# Patient Record
Sex: Female | Born: 1940
Health system: Southern US, Community
[De-identification: ages and names within clinical notes are randomized; demographics above are authoritative.]

## PROBLEM LIST (undated history)

## (undated) ENCOUNTER — Ambulatory Visit (HOSPITAL_BASED_OUTPATIENT_CLINIC_OR_DEPARTMENT_OTHER)

## (undated) DIAGNOSIS — I10 Essential (primary) hypertension: Secondary | ICD-10-CM

## (undated) DIAGNOSIS — F0390 Unspecified dementia without behavioral disturbance: Secondary | ICD-10-CM

## (undated) DIAGNOSIS — E282 Polycystic ovarian syndrome: Secondary | ICD-10-CM

## (undated) DIAGNOSIS — M7989 Other specified soft tissue disorders: Secondary | ICD-10-CM

## (undated) DIAGNOSIS — R413 Other amnesia: Secondary | ICD-10-CM

## (undated) HISTORY — DX: Other specified soft tissue disorders: M79.89

## (undated) HISTORY — PX: CHOLECYSTECTOMY: SHX55

## (undated) HISTORY — DX: Other amnesia: R41.3

## (undated) HISTORY — PX: OTHER SURGICAL HISTORY: SHX169

## (undated) HISTORY — PX: ABDOMINAL HYSTERECTOMY: SHX81

## (undated) HISTORY — DX: Unspecified dementia, unspecified severity, without behavioral disturbance, psychotic disturbance, mood disturbance, and anxiety: F03.90

---

## 1999-09-16 ENCOUNTER — Ambulatory Visit (HOSPITAL_COMMUNITY): Admission: RE | Admit: 1999-09-16 | Discharge: 1999-09-16 | Payer: Self-pay | Admitting: Family Medicine

## 2011-11-28 ENCOUNTER — Ambulatory Visit (HOSPITAL_COMMUNITY)
Admission: RE | Admit: 2011-11-28 | Discharge: 2011-11-28 | Disposition: A | Discharge status: Home / Self Care | Payer: BC Managed Care – PPO | Source: Ambulatory Visit | Attending: Obstetrics and Gynecology | Admitting: Obstetrics and Gynecology

## 2011-11-28 DIAGNOSIS — M7989 Other specified soft tissue disorders: Secondary | ICD-10-CM

## 2011-11-28 DIAGNOSIS — M79609 Pain in unspecified limb: Secondary | ICD-10-CM | POA: Insufficient documentation

## 2011-11-28 DIAGNOSIS — M79606 Pain in leg, unspecified: Secondary | ICD-10-CM

## 2011-11-28 HISTORY — DX: Other specified soft tissue disorders: M79.89

## 2011-11-28 NOTE — Progress Notes (Signed)
VASCULAR LAB PRELIMINARY  PRELIMINARY  PRELIMINARY  PRELIMINARY  Left lower venous duplex completed.    Preliminary report: Left leg is negative for deep and superficial vein thrombosis.   Vanna Scotland,   RVT                11/28/2011, 3:48 PM

## 2012-04-01 ENCOUNTER — Encounter (INDEPENDENT_AMBULATORY_CARE_PROVIDER_SITE_OTHER): Payer: BC Managed Care – PPO | Admitting: Ophthalmology

## 2012-04-01 DIAGNOSIS — H43819 Vitreous degeneration, unspecified eye: Secondary | ICD-10-CM

## 2012-04-01 DIAGNOSIS — H33309 Unspecified retinal break, unspecified eye: Secondary | ICD-10-CM

## 2012-04-01 DIAGNOSIS — H3530 Unspecified macular degeneration: Secondary | ICD-10-CM

## 2012-04-08 ENCOUNTER — Ambulatory Visit (INDEPENDENT_AMBULATORY_CARE_PROVIDER_SITE_OTHER): Payer: BC Managed Care – PPO | Admitting: Ophthalmology

## 2012-04-08 DIAGNOSIS — H33309 Unspecified retinal break, unspecified eye: Secondary | ICD-10-CM

## 2012-08-05 ENCOUNTER — Encounter (HOSPITAL_COMMUNITY): Payer: Self-pay | Admitting: Adult Health

## 2012-08-05 ENCOUNTER — Emergency Department (HOSPITAL_COMMUNITY): Payer: BC Managed Care – PPO

## 2012-08-05 ENCOUNTER — Emergency Department (HOSPITAL_COMMUNITY)
Admission: EM | Admit: 2012-08-05 | Discharge: 2012-08-06 | Disposition: A | Discharge status: Home / Self Care | Payer: BC Managed Care – PPO | Attending: Emergency Medicine | Admitting: Emergency Medicine

## 2012-08-05 DIAGNOSIS — R0789 Other chest pain: Secondary | ICD-10-CM | POA: Insufficient documentation

## 2012-08-05 DIAGNOSIS — I10 Essential (primary) hypertension: Secondary | ICD-10-CM | POA: Insufficient documentation

## 2012-08-05 DIAGNOSIS — R0602 Shortness of breath: Secondary | ICD-10-CM | POA: Insufficient documentation

## 2012-08-05 DIAGNOSIS — Z8742 Personal history of other diseases of the female genital tract: Secondary | ICD-10-CM | POA: Insufficient documentation

## 2012-08-05 HISTORY — DX: Essential (primary) hypertension: I10

## 2012-08-05 HISTORY — DX: Polycystic ovarian syndrome: E28.2

## 2012-08-05 LAB — BASIC METABOLIC PANEL
Calcium: 9.8 mg/dL (ref 8.4–10.5)
GFR calc Af Amer: >90 mL/min (ref >90)
GFR calc non Af Amer: >90 mL/min (ref >90)
Sodium: 135 mEq/L (ref 135–145)

## 2012-08-05 LAB — CBC
MCH: 28.6 pg (ref 26.0–34.0)
MCHC: 34.1 g/dL (ref 30.0–36.0)
Platelets: 296 10*3/uL (ref 150–400)
RBC: 4.58 MIL/uL (ref 3.87–5.11)
RDW: 13.3 % (ref 11.5–15.5)

## 2012-08-05 LAB — POCT I-STAT TROPONIN I: Troponin i, poc: 0.01 ng/mL (ref 0.00–0.08)

## 2012-08-05 MED ORDER — NITROGLYCERIN 0.4 MG SL SUBL: 0.4000 mg | SUBLINGUAL_TABLET | SUBLINGUAL | Status: DC | PRN

## 2012-08-05 NOTE — ED Notes (Signed)
Report taken from [facility], I was advised that EDP would like to "hold off on IV for now".  Await further orders at this time

## 2012-08-05 NOTE — ED Notes (Signed)
Patient transported to X-ray 

## 2012-08-05 NOTE — ED Notes (Signed)
Dr. Rancour at bedside. 

## 2012-08-05 NOTE — ED Provider Notes (Signed)
History     CSN: [id number]  Arrival date & time 08/05/12  2101   First MD Initiated Contact with Patient 08/05/12 2107      Chief Complaint  Patient presents with  . Chest Pain    (Consider location/radiation/quality/duration/timing/severity/associated sxs/prior treatment) HPI Comments: Patient history hypertension, no known coronary artery disease, on metformin for PCOS -- presents with complaint of chest tightness and shortness of breath that began at about 2 PM today. Patient states that the symptoms were worsened because she was rushing to make an appointment. She had her blood pressure checked and found to be elevated. Patient states that it continued to get higher at home so she went to an urgent care and was told to come to the emergency department for evaluation. Tightness is retrosternal it does not radiate. It was not associated with palpitations or nausea/vomiting. Patient was given aspirin prior to arrival. Patient denies other medical complaints today. Onset of symptoms acute. Course is constant. Nothing makes symptoms better.  The history is provided by the patient.    Past Medical History  Diagnosis Date  . Hypertension   . Polycystic disease, ovaries     History reviewed. No pertinent past surgical history.  History reviewed. No pertinent family history.  History  Substance Use Topics  . Smoking status: Never Smoker   . Smokeless tobacco: Not on file  . Alcohol Use: No    OB History    Grav Para Term Preterm Abortions TAB SAB Ect Mult Living                  Review of Systems  Constitutional: Negative for fever and diaphoresis.  HENT: Negative for neck pain.   Eyes: Negative for redness.  Respiratory: Positive for shortness of breath. Negative for cough.   Cardiovascular: Positive for chest pain. Negative for palpitations and leg swelling.  Gastrointestinal: Negative for nausea, vomiting and abdominal pain.  Genitourinary: Negative for dysuria.    Musculoskeletal: Negative for back pain.  Skin: Negative for rash.  Neurological: Negative for syncope and light-headedness.    Allergies  Sulfa antibiotics  Home Medications  No current outpatient prescriptions on file.  There were no vitals taken for this visit.  Physical Exam  Nursing note and vitals reviewed. Constitutional: She appears well-developed and well-nourished.  HENT:  Head: Normocephalic and atraumatic.  Mouth/Throat: Mucous membranes are normal. Mucous membranes are not dry.  Eyes: Conjunctivae normal are normal.  Neck: Trachea normal and normal range of motion. Neck supple. Normal carotid pulses and no JVD present. No muscular tenderness present. Carotid bruit is not present. No tracheal deviation present.  Cardiovascular: Normal rate, regular rhythm, S1 normal, S2 normal, normal heart sounds and intact distal pulses.  Exam reveals no decreased pulses.   No murmur heard. Pulmonary/Chest: Effort normal. No respiratory distress. She has no wheezes. She exhibits no tenderness.  Abdominal: Soft. Normal aorta and bowel sounds are normal. There is no tenderness. There is no rebound and no guarding.  Musculoskeletal: Normal range of motion.  Neurological: She is alert.  Skin: Skin is warm and dry. She is not diaphoretic. No cyanosis. No pallor.  Psychiatric: She has a normal mood and affect.    ED Course  Procedures (including critical care time)  Labs Reviewed  BASIC METABOLIC PANEL - Abnormal; Notable for the following:    Potassium 3.3 (*)     Chloride 93 (*)     All other components within normal limits  CBC  POCT  I-STAT TROPONIN I  POCT I-STAT TROPONIN I  LAB REPORT - SCANNED   Dg Chest 2 View  08/05/2012  *RADIOLOGY REPORT*  Clinical Data: Chest tightness, hypertension  CHEST - 2 VIEW  Comparison: None.  Findings: Lungs are clear. No pleural effusion or pneumothorax.  Cardiomediastinal silhouette is within normal limits.  Degenerative changes of the  visualized thoracolumbar spine.  Cholecystectomy clips.  IMPRESSION: No evidence of acute cardiopulmonary disease.   Original Report Authenticated By: Charline Bills, M.D.      1. Chest tightness   2. Hypertension     9:14 PM Patient seen and examined. Work-up initiated. Medications ordered.   Vital signs reviewed and are as follows: Filed Vitals:   08/05/12 2115  BP: 168/134  Pulse: 78     Date: 08/05/2012  Rate: 75  Rhythm: normal sinus rhythm  QRS Axis: normal  Intervals: normal  ST/T Wave abnormalities: normal  Conduction Disutrbances:none  Narrative Interpretation:   Old EKG Reviewed: none available  11:41 PM Chest pain is now resolved. She did not receive NTG.   Patient seen previously by Dr. Manus Gunning. Will get 2nd troponin. If neg, patient can follow-up with PCP.   Patient was counseled to return with severe chest pain, especially if the pain is crushing or pressure-like and spreads to the arms, back, neck, or jaw, or if they have sweating, nausea, or shortness of breath with the pain. They were encouraged to call 911 with these symptoms.   They were also told to return if their chest pain gets worse and does not go away with rest, they have an attack of chest pain lasting longer than usual despite rest and treatment with the medications their caregiver has prescribed, if they wake from sleep with chest pain or shortness of breath, if they feel dizzy or faint, if they have chest pain not typical of their usual pain, or if they have any other emergent concerns regarding their health.  The patient verbalized understanding and agreed.     MDM  Patient with chest tightness.  Doubt cardiac origin of pain given: neg cardiac enzymes, normal EKG,  no exertional component, lack of accompanying sx including palps, sweating, SOB, radiation. Patient has HTN without signs of end-organ damage. Patient seems reliable and is agreeable to follow-up -- return if worsening.   EKG  normal.          Renne Crigler, PA 08/06/12 1552

## 2012-08-05 NOTE — ED Provider Notes (Signed)
This chart was scribed for Maria Octave, MD by Bennett Scrape, ED Scribe. This patient was seen in room A03C/A03C and the patient's care was started at 10:25 PM.  Maria Gilmore is a 72 y.o. female who presents to the Emergency Department from [facility] complaining of HTN with associated intermittent chest tightness lasted a few seconds at a time that she attributes to anxiety. Pt states that her mother is in hospice currently and reports that she went to visit her today during her lunch break. She states that she "hurried up" one flight of stairs to get to her mother's room to see the hospice nurse. When she arrived in the room, she felt SOB and nurse took BP and noted it was high. She reports taking her BP 3 more subsequent times with an increase each time. BP is 172/83. She denies having symptoms currently. Pt denies having SOB at baseline, with exertion or orthopnea. She is currently on hydrochlorothiazide for HTN and denies recent missed doses or changes in medications. She reports prior episodes of chest tightness during times of stress and denies having prior heart or lung conditions. She also denies having prior stress or catheterizations done. She denies abdominal pain, diaphoresis, emesis and nausea as associated symptoms. She is also on metformin for polycystic ovaries.   PCP is Dr. Tilden Fossa Sees Dr. Allyson Sabal for HTN  PE: CONSITUTIONAL: No distress CV: Regular rate and rhythm  CHEST: No reproducible chest tenderness upon palpation  LUNG: No distress  10:35 PM-Advised pt that I don't believe that her symptoms are cardiac related. Discussed treatment plan which includes outpatient workup with pt at bedside and pt agreed to plan.   Atypical chest "tightness" lasting a few seconds at a time, now resolved.  I personally performed the services described in this documentation, which was scribed in my presence. The recorded information has been reviewed and is accurate.  12:11 AM 72 y/o  female awaiting second troponin results, if negative discharge. Second troponin negative. Patient will be discharged. Instructions were given to her from [place], [place].  Trevor Mace, PA-C 08/06/12 0012  Maria Octave, MD 08/06/12 [phone]

## 2012-08-05 NOTE — ED Notes (Addendum)
Presents with high blood pressure, chest pain and SOB that began today. She went to work and took her blood pressure and it was 172/90. Chest pain is described as tightness and that is worse with exertion and associated with SOB. Denies pedal edema. She denies pain at this time.

## 2012-08-06 NOTE — ED Provider Notes (Signed)
Medical screening examination/treatment/procedure(s) were conducted as a shared visit with non-physician practitioner(s) and myself.  I personally evaluated the patient during the encounter  See my additional note  [name], MD 08/06/12 2204

## 2012-08-08 ENCOUNTER — Other Ambulatory Visit (HOSPITAL_COMMUNITY): Payer: Self-pay | Admitting: Cardiology

## 2012-08-08 DIAGNOSIS — R079 Chest pain, unspecified: Secondary | ICD-10-CM

## 2012-08-12 ENCOUNTER — Ambulatory Visit (INDEPENDENT_AMBULATORY_CARE_PROVIDER_SITE_OTHER): Payer: BC Managed Care – PPO | Admitting: Ophthalmology

## 2012-08-12 DIAGNOSIS — H35039 Hypertensive retinopathy, unspecified eye: Secondary | ICD-10-CM

## 2012-08-12 DIAGNOSIS — H2510 Age-related nuclear cataract, unspecified eye: Secondary | ICD-10-CM

## 2012-08-12 DIAGNOSIS — H43819 Vitreous degeneration, unspecified eye: Secondary | ICD-10-CM

## 2012-08-12 DIAGNOSIS — I10 Essential (primary) hypertension: Secondary | ICD-10-CM

## 2012-08-12 DIAGNOSIS — H33309 Unspecified retinal break, unspecified eye: Secondary | ICD-10-CM

## 2012-08-16 ENCOUNTER — Ambulatory Visit (HOSPITAL_COMMUNITY)
Admission: RE | Admit: 2012-08-16 | Discharge: 2012-08-16 | Disposition: A | Discharge status: Home / Self Care | Payer: BC Managed Care – PPO | Source: Ambulatory Visit | Attending: Cardiovascular Disease | Admitting: Cardiovascular Disease

## 2012-08-16 DIAGNOSIS — R079 Chest pain, unspecified: Secondary | ICD-10-CM | POA: Insufficient documentation

## 2012-08-16 DIAGNOSIS — I10 Essential (primary) hypertension: Secondary | ICD-10-CM | POA: Insufficient documentation

## 2012-08-16 DIAGNOSIS — R0602 Shortness of breath: Secondary | ICD-10-CM | POA: Insufficient documentation

## 2012-08-16 HISTORY — PX: OTHER SURGICAL HISTORY: SHX169

## 2012-08-16 MED ORDER — TECHNETIUM TC 99M SESTAMIBI GENERIC - CARDIOLITE
10.6000 | Freq: Once | INTRAVENOUS | Status: AC | PRN
  Administered 2012-08-16: 11 via INTRAVENOUS

## 2012-08-16 MED ORDER — TECHNETIUM TC 99M SESTAMIBI GENERIC - CARDIOLITE
30.8000 | Freq: Once | INTRAVENOUS | Status: AC | PRN
  Administered 2012-08-16: 30.8 via INTRAVENOUS

## 2012-08-16 NOTE — Procedures (Addendum)
[facility] [address] [place] [place] [address] [phone]  Cardiology Nuclear Med Study  Maria Gilmore is a 72 y.o. female     MRN : [id number]     DOB: 12/06/1940  Procedure Date: 08/16/2012  Nuclear Med Background Indication for Stress Test:  Evaluation for Ischemia and Post Hospital History:  no prior cardiac history Cardiac Risk Factors: Hypertension  Symptoms:  Chest Pain and SOB   Nuclear Pre-Procedure Caffeine/Decaff Intake:  7:00pm NPO After: 5:00am   IV Site: R Antecubital  IV 0.9% NS with Angio Cath:  22g  Chest Size (in):  n/a IV Started by: Koren Shiver, CNMT  Height: 5\' 2"  (1.575 m)  Cup Size: C  BMI:  Body mass index is 23.41 kg/(m^2). Weight:  128 lb (58.06 kg)   Tech Comments:  n/a    Nuclear Med Study 1 or 2 day study: 1 day  Stress Test Type:  Stress  Order Authorizing Provider:  Nanetta Batty, MD   Resting Radionuclide: Technetium 30m Sestamibi  Resting Radionuclide Dose: 10.6 mCi   Stress Radionuclide:  Technetium 79m Sestamibi  Stress Radionuclide Dose: 30.8 mCi           Stress Protocol Rest HR: 74 Stress HR: 146  Rest BP: 160/93 Stress BP: 209/107  Exercise Time (min): 6:00 METS: 7.1   Predicted Max HR: 151 bpm % Max HR: 96.69 bpm Rate Pressure Product: 46962   Dose of Adenosine (mg):  n/a Dose of Lexiscan: n/a mg  Dose of Atropine (mg): n/a Dose of Dobutamine: n/a mcg/kg/min (at max HR)  Stress Test Technologist: Esperanza Sheets, CCT Nuclear Technologist: Gonzella Lex, CNMT   Rest Procedure:  Myocardial perfusion imaging was performed at rest 45 minutes following the intravenous administration of Technetium 72m Sestamibi. Stress Procedure:  The patient performed treadmill exercise using a Bruce  Protocol for 6:00 minutes. The patient stopped due to SOB and denied any chest pain.  There were no significant ST-T wave changes.  Technetium 31m Sestamibi was injected at peak exercise and  myocardial perfusion imaging was performed after a brief delay.  Transient Ischemic Dilatation (Normal <1.22):  1.08 Lung/Heart Ratio (Normal <0.45):  0.26 QGS EDV:  37 ml QGS ESV:  7 ml LV Ejection Fraction: 80%  Signed by .     Rest ECG: NSR - Normal EKG  Stress ECG: No significant ST segment change suggestive of ischemia. Rare PVC  QPS Raw Data Images:  Normal; no motion artifact; normal heart/lung ratio. Stress Images:  Normal homogeneous uptake in all areas of the myocardium. Rest Images:  Normal homogeneous uptake in all areas of the myocardium. Subtraction (SDS):  Normal  Impression Exercise Capacity:  Good exercise capacity. BP Response:  Hypertensive blood pressure response. Clinical Symptoms:  No symptoms. ECG Impression:  No significant ST segment change suggestive of ischemia. Comparison with Prior Nuclear Study: No previous nuclear study performed  Overall Impression:  Normal stress nuclear study. Low risk stress nuclear study.  LV Wall Motion:  NL LV Function, EF 80%; NL Wall Motion   Nicki Guadalajara A, MD  08/16/2012 12:22 PM

## 2012-09-30 ENCOUNTER — Encounter: Payer: Self-pay | Admitting: *Deleted

## 2012-10-14 ENCOUNTER — Encounter: Payer: Self-pay | Admitting: Cardiovascular Disease

## 2013-06-03 ENCOUNTER — Ambulatory Visit (INDEPENDENT_AMBULATORY_CARE_PROVIDER_SITE_OTHER): Payer: BC Managed Care – PPO | Admitting: Cardiology

## 2013-06-03 ENCOUNTER — Encounter: Payer: Self-pay | Admitting: Cardiology

## 2013-06-03 VITALS — BP 140/70 | HR 76 | Ht 62.0 in | Wt 134.0 lb

## 2013-06-03 DIAGNOSIS — I10 Essential (primary) hypertension: Secondary | ICD-10-CM | POA: Insufficient documentation

## 2013-06-03 DIAGNOSIS — E282 Polycystic ovarian syndrome: Secondary | ICD-10-CM

## 2013-06-03 NOTE — Progress Notes (Signed)
06/03/2013 Maria Gilmore   [birth year]  [id number]  Primary Physicia Alysia Penna, MD Primary Cardiologist: Dr Allyson Sabal  HPI:  The patient is seen in the office today in followup. She had had an episode of chest pain and was seen in the emergency room in Jan 2014. She was hypertensive during that episode. It turns out she has been under a great deal of stress at home and the patient attributed her symptoms to that. We saw her in the office August 08, 2012, and set her up for an exercise Myoview. This was done August 16, 2012. She exercised 6 minutes of the Bruce protocol to a maximum heart rate 146. There was no significant ischemia and normal LV function. On that August 08, 2012, visit, we added Cozaar 25 mg and cut her diuretic back. Since we saw her last she has been doing well. She had her Cozaar increased to 50 mg but says she couldn't tolerate that dose. She has been doing well with her B/P.     Current Outpatient Prescriptions  Medication Sig Dispense Refill  . cholecalciferol (VITAMIN D) 1000 UNITS tablet Take 1,000 Units by mouth daily.      . hydrochlorothiazide (HYDRODIURIL) 25 MG tablet Take 50 mg by mouth daily.      Marland Kitchen losartan (COZAAR) 25 MG tablet Take 25 mg by mouth daily.      . metFORMIN (GLUCOPHAGE) 1000 MG tablet Take 1,000 mg by mouth 2 (two) times daily with a meal.      . vitamin E 100 UNIT capsule Take 100 Units by mouth daily.       No current facility-administered medications for this visit.    Allergies  Allergen Reactions  . Kenalog [Triamcinolone]   . Sulfa Antibiotics     History   Social History  . Marital Status: Married    Spouse Name: N/A    Number of Children: N/A  . Years of Education: N/A   Occupational History  . Not on file.   Social History Main Topics  . Smoking status: Never Smoker   . Smokeless tobacco: Not on file  . Alcohol Use: No  . Drug Use: No  . Sexual Activity: Not on file   Other Topics Concern  . Not on file    Social History Narrative  . No narrative on file   She lives near her 52 y/o father who still lives alone in his own home. Her mother died last year. She is still working at [employer]  Review of Systems: General: negative for chills, fever, night sweats or weight changes.  Cardiovascular: negative for chest pain, dyspnea on exertion, edema, orthopnea, palpitations, paroxysmal nocturnal dyspnea or shortness of breath Dermatological: negative for rash Respiratory: negative for cough or wheezing Urologic: negative for hematuria Abdominal: negative for nausea, vomiting, diarrhea, bright red blood per rectum, melena, or hematemesis Neurologic: negative for visual changes, syncope, or dizziness She will need cataract surgery soon She could not tolerate higher dose of Cozaar-"itching" All other systems reviewed and are otherwise negative except as noted above.    Blood pressure 140/70, pulse 76, height 5\' 2"  (1.575 m), weight 134 lb (60.782 kg).  General appearance: alert, cooperative and no distress Neck: no carotid bruit and no JVD Lungs: clear to auscultation bilaterally Heart: regular rate and rhythm  EKG NSR   ASSESSMENT AND PLAN:   HTN (hypertension) B/P controlled  Polycystic ovarian disease On Metformin   PLAN  She had a low risk Myoview  after an ER visit for chest pain in Feb 2014. No chest pain. We can see her on on yearly.   Maria Gilmore KPA-C 06/03/2013 9:18 AM

## 2013-06-03 NOTE — Assessment & Plan Note (Signed)
B/P controlled 

## 2013-06-03 NOTE — Patient Instructions (Signed)
Your physician recommends that you schedule a follow-up appointment in: one year with Dr. Berry  

## 2013-06-03 NOTE — Assessment & Plan Note (Signed)
On Metformin 

## 2013-06-04 ENCOUNTER — Encounter: Payer: Self-pay | Admitting: Cardiology

## 2014-01-16 DIAGNOSIS — Z01419 Encounter for gynecological examination (general) (routine) without abnormal findings: Secondary | ICD-10-CM | POA: Diagnosis not present

## 2014-01-16 DIAGNOSIS — E559 Vitamin D deficiency, unspecified: Secondary | ICD-10-CM | POA: Diagnosis not present

## 2014-01-16 DIAGNOSIS — Z79899 Other long term (current) drug therapy: Secondary | ICD-10-CM | POA: Diagnosis not present

## 2014-01-16 DIAGNOSIS — E282 Polycystic ovarian syndrome: Secondary | ICD-10-CM | POA: Diagnosis not present

## 2014-02-03 DIAGNOSIS — M949 Disorder of cartilage, unspecified: Secondary | ICD-10-CM | POA: Diagnosis not present

## 2014-02-03 DIAGNOSIS — M899 Disorder of bone, unspecified: Secondary | ICD-10-CM | POA: Diagnosis not present

## 2014-02-03 DIAGNOSIS — I10 Essential (primary) hypertension: Secondary | ICD-10-CM | POA: Diagnosis not present

## 2014-02-10 DIAGNOSIS — E559 Vitamin D deficiency, unspecified: Secondary | ICD-10-CM | POA: Diagnosis not present

## 2014-02-10 DIAGNOSIS — Z0000 Encounter for general adult medical examination without abnormal findings: Secondary | ICD-10-CM | POA: Diagnosis not present

## 2014-02-10 DIAGNOSIS — IMO0002 Reserved for concepts with insufficient information to code with codable children: Secondary | ICD-10-CM | POA: Diagnosis not present

## 2014-02-10 DIAGNOSIS — Z23 Encounter for immunization: Secondary | ICD-10-CM | POA: Diagnosis not present

## 2014-02-10 DIAGNOSIS — M949 Disorder of cartilage, unspecified: Secondary | ICD-10-CM | POA: Diagnosis not present

## 2014-02-10 DIAGNOSIS — E282 Polycystic ovarian syndrome: Secondary | ICD-10-CM | POA: Diagnosis not present

## 2014-02-10 DIAGNOSIS — H269 Unspecified cataract: Secondary | ICD-10-CM | POA: Diagnosis not present

## 2014-02-10 DIAGNOSIS — M899 Disorder of bone, unspecified: Secondary | ICD-10-CM | POA: Diagnosis not present

## 2014-02-10 DIAGNOSIS — Z1331 Encounter for screening for depression: Secondary | ICD-10-CM | POA: Diagnosis not present

## 2014-02-10 DIAGNOSIS — I10 Essential (primary) hypertension: Secondary | ICD-10-CM | POA: Diagnosis not present

## 2014-02-12 DIAGNOSIS — Z1212 Encounter for screening for malignant neoplasm of rectum: Secondary | ICD-10-CM | POA: Diagnosis not present

## 2014-04-07 DIAGNOSIS — Z23 Encounter for immunization: Secondary | ICD-10-CM | POA: Diagnosis not present

## 2014-05-28 DIAGNOSIS — H33322 Round hole, left eye: Secondary | ICD-10-CM | POA: Diagnosis not present

## 2014-05-29 ENCOUNTER — Ambulatory Visit (INDEPENDENT_AMBULATORY_CARE_PROVIDER_SITE_OTHER): Payer: Medicare Other | Admitting: Cardiovascular Disease

## 2014-05-29 ENCOUNTER — Encounter: Payer: Self-pay | Admitting: Cardiovascular Disease

## 2014-05-29 VITALS — BP 144/88 | HR 70 | Ht 61.5 in | Wt 128.3 lb

## 2014-05-29 DIAGNOSIS — I10 Essential (primary) hypertension: Secondary | ICD-10-CM | POA: Diagnosis not present

## 2014-05-29 NOTE — Patient Instructions (Signed)
Your physician wants you to follow-up in: 1 year with Dr Berry. You will receive a reminder letter in the mail two months in advance. If you don't receive a letter, please call our office to schedule the follow-up appointment.  

## 2014-05-29 NOTE — Progress Notes (Signed)
     05/29/2014 Maria Gilmore   February 09, 1941  [id number]  Primary Physician Velna Hatchet, MD Primary Cardiologist: Lorretta Harp MD Renae Gloss   HPI:  Maria Gilmore  is a 73 year old thin-appearing liver Caucasian female, mother of 2 children, grandmother of 5 grandchildren who I initially saw at the request of Dr. Vernice Jefferson 06/26/11. She has a history of hypertension and polycystic ovarian disease on metformin. She had a Myoview stress test performed 08/26/12 which is entirely normal. She denies chest pain or shortness of breath. She does exercise several days a week and has adjusted her diet accordingly.   Current Outpatient Prescriptions  Medication Sig Dispense Refill  . cholecalciferol (VITAMIN D) 1000 UNITS tablet Take 1,000 Units by mouth daily.    . hydrochlorothiazide (HYDRODIURIL) 25 MG tablet Take 25 mg by mouth daily.     Marland Kitchen losartan (COZAAR) 25 MG tablet Take 25 mg by mouth daily.    . metFORMIN (GLUCOPHAGE) 1000 MG tablet Take 1,000 mg by mouth 2 (two) times daily with a meal.    . vitamin E 100 UNIT capsule Take 100 Units by mouth daily.     No current facility-administered medications for this visit.    Allergies  Allergen Reactions  . Kenalog [Triamcinolone]   . Sulfa Antibiotics     History   Social History  . Marital Status: Married    Spouse Name: N/A    Number of Children: N/A  . Years of Education: N/A   Occupational History  . Not on file.   Social History Main Topics  . Smoking status: Never Smoker   . Smokeless tobacco: Not on file  . Alcohol Use: No  . Drug Use: No  . Sexual Activity: Not on file   Other Topics Concern  . Not on file   Social History Narrative     Review of Systems: General: negative for chills, fever, night sweats or weight changes.  Cardiovascular: negative for chest pain, dyspnea on exertion, edema, orthopnea, palpitations, paroxysmal nocturnal dyspnea or shortness of breath Dermatological: negative  for rash Respiratory: negative for cough or wheezing Urologic: negative for hematuria Abdominal: negative for nausea, vomiting, diarrhea, bright red blood per rectum, melena, or hematemesis Neurologic: negative for visual changes, syncope, or dizziness All other systems reviewed and are otherwise negative except as noted above.    Pulse 70, height 5' 1.5" (1.562 m), weight 128 lb 4.8 oz (58.196 kg).  General appearance: alert and no distress Neck: no adenopathy, no carotid bruit, no JVD, supple, symmetrical, trachea midline and thyroid not enlarged, symmetric, no tenderness/mass/nodules Lungs: clear to auscultation bilaterally Heart: regular rate and rhythm, S1, S2 normal, no murmur, click, rub or gallop Extremities: extremities normal, atraumatic, no cyanosis or edema  EKG normal sinus rhythm at 70 without ST or T-wave changes. I personally reviewed this EKG  ASSESSMENT AND PLAN:   HTN (hypertension) History of hypertension with blood pressure today measured at 144/88 on losartan. Continue current medications at current dosing      Lorretta Harp MD [facility] 05/29/2014 10:14 AM

## 2014-05-29 NOTE — Assessment & Plan Note (Addendum)
History of hypertension with blood pressure today measured at 144/88 on losartan. Continue current medications at current dosing

## 2014-06-12 DIAGNOSIS — D1801 Hemangioma of skin and subcutaneous tissue: Secondary | ICD-10-CM | POA: Diagnosis not present

## 2014-06-12 DIAGNOSIS — L814 Other melanin hyperpigmentation: Secondary | ICD-10-CM | POA: Diagnosis not present

## 2014-06-16 DIAGNOSIS — H26492 Other secondary cataract, left eye: Secondary | ICD-10-CM | POA: Diagnosis not present

## 2014-06-16 DIAGNOSIS — Z961 Presence of intraocular lens: Secondary | ICD-10-CM | POA: Diagnosis not present

## 2014-06-16 DIAGNOSIS — H18411 Arcus senilis, right eye: Secondary | ICD-10-CM | POA: Diagnosis not present

## 2014-06-16 DIAGNOSIS — H18412 Arcus senilis, left eye: Secondary | ICD-10-CM | POA: Diagnosis not present

## 2014-06-16 DIAGNOSIS — I10 Essential (primary) hypertension: Secondary | ICD-10-CM | POA: Diagnosis not present

## 2014-06-29 DIAGNOSIS — M7981 Nontraumatic hematoma of soft tissue: Secondary | ICD-10-CM | POA: Diagnosis not present

## 2014-06-29 DIAGNOSIS — L821 Other seborrheic keratosis: Secondary | ICD-10-CM | POA: Diagnosis not present

## 2014-06-29 DIAGNOSIS — L218 Other seborrheic dermatitis: Secondary | ICD-10-CM | POA: Diagnosis not present

## 2014-06-29 DIAGNOSIS — L814 Other melanin hyperpigmentation: Secondary | ICD-10-CM | POA: Diagnosis not present

## 2014-06-29 DIAGNOSIS — L720 Epidermal cyst: Secondary | ICD-10-CM | POA: Diagnosis not present

## 2014-06-29 DIAGNOSIS — D1801 Hemangioma of skin and subcutaneous tissue: Secondary | ICD-10-CM | POA: Diagnosis not present

## 2014-08-25 DIAGNOSIS — Z1231 Encounter for screening mammogram for malignant neoplasm of breast: Secondary | ICD-10-CM | POA: Diagnosis not present

## 2014-09-08 DIAGNOSIS — Z6823 Body mass index (BMI) 23.0-23.9, adult: Secondary | ICD-10-CM | POA: Diagnosis not present

## 2014-09-08 DIAGNOSIS — S76012A Strain of muscle, fascia and tendon of left hip, initial encounter: Secondary | ICD-10-CM | POA: Diagnosis not present

## 2014-09-08 DIAGNOSIS — R05 Cough: Secondary | ICD-10-CM | POA: Diagnosis not present

## 2014-09-08 DIAGNOSIS — R1032 Left lower quadrant pain: Secondary | ICD-10-CM | POA: Diagnosis not present

## 2015-01-28 DIAGNOSIS — Z124 Encounter for screening for malignant neoplasm of cervix: Secondary | ICD-10-CM | POA: Diagnosis not present

## 2015-01-28 DIAGNOSIS — Z6823 Body mass index (BMI) 23.0-23.9, adult: Secondary | ICD-10-CM | POA: Diagnosis not present

## 2015-02-08 DIAGNOSIS — M859 Disorder of bone density and structure, unspecified: Secondary | ICD-10-CM | POA: Diagnosis not present

## 2015-02-08 DIAGNOSIS — I10 Essential (primary) hypertension: Secondary | ICD-10-CM | POA: Diagnosis not present

## 2015-02-15 DIAGNOSIS — Z1212 Encounter for screening for malignant neoplasm of rectum: Secondary | ICD-10-CM | POA: Diagnosis not present

## 2015-02-15 DIAGNOSIS — E559 Vitamin D deficiency, unspecified: Secondary | ICD-10-CM | POA: Diagnosis not present

## 2015-02-15 DIAGNOSIS — Z23 Encounter for immunization: Secondary | ICD-10-CM | POA: Diagnosis not present

## 2015-02-15 DIAGNOSIS — Z0000 Encounter for general adult medical examination without abnormal findings: Secondary | ICD-10-CM | POA: Diagnosis not present

## 2015-02-15 DIAGNOSIS — E282 Polycystic ovarian syndrome: Secondary | ICD-10-CM | POA: Diagnosis not present

## 2015-02-15 DIAGNOSIS — I10 Essential (primary) hypertension: Secondary | ICD-10-CM | POA: Diagnosis not present

## 2015-02-15 DIAGNOSIS — Z6823 Body mass index (BMI) 23.0-23.9, adult: Secondary | ICD-10-CM | POA: Diagnosis not present

## 2015-02-15 DIAGNOSIS — Z1389 Encounter for screening for other disorder: Secondary | ICD-10-CM | POA: Diagnosis not present

## 2015-02-15 DIAGNOSIS — M859 Disorder of bone density and structure, unspecified: Secondary | ICD-10-CM | POA: Diagnosis not present

## 2015-04-30 DIAGNOSIS — Z23 Encounter for immunization: Secondary | ICD-10-CM | POA: Diagnosis not present

## 2015-06-01 ENCOUNTER — Ambulatory Visit (INDEPENDENT_AMBULATORY_CARE_PROVIDER_SITE_OTHER): Payer: Medicare Other | Admitting: Cardiovascular Disease

## 2015-06-01 ENCOUNTER — Encounter: Payer: Self-pay | Admitting: Cardiovascular Disease

## 2015-06-01 VITALS — BP 152/78 | HR 68 | Ht 62.0 in | Wt 127.0 lb

## 2015-06-01 DIAGNOSIS — I10 Essential (primary) hypertension: Secondary | ICD-10-CM

## 2015-06-01 DIAGNOSIS — Z0000 Encounter for general adult medical examination without abnormal findings: Secondary | ICD-10-CM | POA: Diagnosis not present

## 2015-06-01 NOTE — Patient Instructions (Signed)

## 2015-06-01 NOTE — Assessment & Plan Note (Signed)
History of hypertension blood pressure measurements at 152/78. She is on hydrochlorothiazide and losartan. Continue current meds at current dosing

## 2015-06-01 NOTE — Progress Notes (Signed)
     06/01/2015 Maria Gilmore   August 31, 1940  [id number]  Primary Physician Velna Hatchet, MD Primary Cardiologist: Lorretta Harp MD Renae Gloss   HPI:   Maria Gilmore is a 74 year old thin-appearing liver Caucasian female, mother of 2 children, grandmother of 5 grandchildren who I initially saw at the request of Dr. Vernice Jefferson 06/26/11. I last saw her in the office 05/29/14. She has a history of hypertension and polycystic ovarian disease on metformin. She had a Myoview stress test performed 08/26/12 which is entirely normal. She denies chest pain or shortness of breath. She does exercise several days a week and has adjusted her diet accordingly.   Current Outpatient Prescriptions  Medication Sig Dispense Refill  . cholecalciferol (VITAMIN D) 1000 UNITS tablet Take 1,000 Units by mouth daily.    . hydrochlorothiazide (HYDRODIURIL) 25 MG tablet Take 25 mg by mouth daily.     Marland Kitchen losartan (COZAAR) 25 MG tablet Take 25 mg by mouth daily.    . metFORMIN (GLUCOPHAGE) 1000 MG tablet Take 1,000 mg by mouth 2 (two) times daily with a meal.    . vitamin E 100 UNIT capsule Take 100 Units by mouth daily.     No current facility-administered medications for this visit.    Allergies  Allergen Reactions  . Kenalog [Triamcinolone]   . Sulfa Antibiotics     Social History   Social History  . Marital Status: Married    Spouse Name: N/A  . Number of Children: N/A  . Years of Education: N/A   Occupational History  . Not on file.   Social History Main Topics  . Smoking status: Never Smoker   . Smokeless tobacco: Not on file  . Alcohol Use: No  . Drug Use: No  . Sexual Activity: Not on file   Other Topics Concern  . Not on file   Social History Narrative     Review of Systems: General: negative for chills, fever, night sweats or weight changes.  Cardiovascular: negative for chest pain, dyspnea on exertion, edema, orthopnea, palpitations, paroxysmal nocturnal dyspnea  or shortness of breath Dermatological: negative for rash Respiratory: negative for cough or wheezing Urologic: negative for hematuria Abdominal: negative for nausea, vomiting, diarrhea, bright red blood per rectum, melena, or hematemesis Neurologic: negative for visual changes, syncope, or dizziness All other systems reviewed and are otherwise negative except as noted above.    Blood pressure 152/78, pulse 68, height 5\' 2"  (1.575 m), weight 127 lb (57.607 kg).  General appearance: alert and no distress Neck: no adenopathy, no carotid bruit, no JVD, supple, symmetrical, trachea midline and thyroid not enlarged, symmetric, no tenderness/mass/nodules Lungs: clear to auscultation bilaterally Heart: regular rate and rhythm, S1, S2 normal, no murmur, click, rub or gallop Extremities: extremities normal, atraumatic, no cyanosis or edema  EKG normal sinus rhythm at 68 without ST or T-wave changes. I personally reviewed this EKG  ASSESSMENT AND PLAN:   HTN (hypertension) History of hypertension blood pressure measurements at 152/78. She is on hydrochlorothiazide and losartan. Continue current meds at current dosing      Lorretta Harp MD [facility], [facility] 06/01/2015 1:44 PM

## 2015-06-02 DIAGNOSIS — H33322 Round hole, left eye: Secondary | ICD-10-CM | POA: Diagnosis not present

## 2015-06-02 DIAGNOSIS — H26491 Other secondary cataract, right eye: Secondary | ICD-10-CM | POA: Diagnosis not present

## 2015-06-29 DIAGNOSIS — H26491 Other secondary cataract, right eye: Secondary | ICD-10-CM | POA: Diagnosis not present

## 2015-06-29 DIAGNOSIS — H18412 Arcus senilis, left eye: Secondary | ICD-10-CM | POA: Diagnosis not present

## 2015-06-29 DIAGNOSIS — H02839 Dermatochalasis of unspecified eye, unspecified eyelid: Secondary | ICD-10-CM | POA: Diagnosis not present

## 2015-06-29 DIAGNOSIS — I10 Essential (primary) hypertension: Secondary | ICD-10-CM | POA: Diagnosis not present

## 2015-06-29 DIAGNOSIS — H18411 Arcus senilis, right eye: Secondary | ICD-10-CM | POA: Diagnosis not present

## 2015-07-22 DIAGNOSIS — E559 Vitamin D deficiency, unspecified: Secondary | ICD-10-CM | POA: Diagnosis not present

## 2015-07-22 DIAGNOSIS — M859 Disorder of bone density and structure, unspecified: Secondary | ICD-10-CM | POA: Diagnosis not present

## 2015-09-03 DIAGNOSIS — L570 Actinic keratosis: Secondary | ICD-10-CM | POA: Diagnosis not present

## 2015-09-03 DIAGNOSIS — D1801 Hemangioma of skin and subcutaneous tissue: Secondary | ICD-10-CM | POA: Diagnosis not present

## 2015-09-03 DIAGNOSIS — Z1231 Encounter for screening mammogram for malignant neoplasm of breast: Secondary | ICD-10-CM | POA: Diagnosis not present

## 2015-09-03 DIAGNOSIS — L720 Epidermal cyst: Secondary | ICD-10-CM | POA: Diagnosis not present

## 2015-09-03 DIAGNOSIS — Z803 Family history of malignant neoplasm of breast: Secondary | ICD-10-CM | POA: Diagnosis not present

## 2015-09-03 DIAGNOSIS — L853 Xerosis cutis: Secondary | ICD-10-CM | POA: Diagnosis not present

## 2015-09-03 DIAGNOSIS — L718 Other rosacea: Secondary | ICD-10-CM | POA: Diagnosis not present

## 2015-09-03 DIAGNOSIS — L821 Other seborrheic keratosis: Secondary | ICD-10-CM | POA: Diagnosis not present

## 2015-09-03 DIAGNOSIS — D225 Melanocytic nevi of trunk: Secondary | ICD-10-CM | POA: Diagnosis not present

## 2015-09-03 DIAGNOSIS — B351 Tinea unguium: Secondary | ICD-10-CM | POA: Diagnosis not present

## 2015-09-03 DIAGNOSIS — L814 Other melanin hyperpigmentation: Secondary | ICD-10-CM | POA: Diagnosis not present

## 2016-02-02 DIAGNOSIS — Z6824 Body mass index (BMI) 24.0-24.9, adult: Secondary | ICD-10-CM | POA: Diagnosis not present

## 2016-02-02 DIAGNOSIS — Z01419 Encounter for gynecological examination (general) (routine) without abnormal findings: Secondary | ICD-10-CM | POA: Diagnosis not present

## 2016-02-11 DIAGNOSIS — I10 Essential (primary) hypertension: Secondary | ICD-10-CM | POA: Diagnosis not present

## 2016-02-11 DIAGNOSIS — R8299 Other abnormal findings in urine: Secondary | ICD-10-CM | POA: Diagnosis not present

## 2016-02-11 DIAGNOSIS — N390 Urinary tract infection, site not specified: Secondary | ICD-10-CM | POA: Diagnosis not present

## 2016-02-11 DIAGNOSIS — E559 Vitamin D deficiency, unspecified: Secondary | ICD-10-CM | POA: Diagnosis not present

## 2016-02-18 DIAGNOSIS — Z6824 Body mass index (BMI) 24.0-24.9, adult: Secondary | ICD-10-CM | POA: Diagnosis not present

## 2016-02-18 DIAGNOSIS — Z1389 Encounter for screening for other disorder: Secondary | ICD-10-CM | POA: Diagnosis not present

## 2016-02-18 DIAGNOSIS — M859 Disorder of bone density and structure, unspecified: Secondary | ICD-10-CM | POA: Diagnosis not present

## 2016-02-18 DIAGNOSIS — Z0000 Encounter for general adult medical examination without abnormal findings: Secondary | ICD-10-CM | POA: Diagnosis not present

## 2016-02-18 DIAGNOSIS — E282 Polycystic ovarian syndrome: Secondary | ICD-10-CM | POA: Diagnosis not present

## 2016-02-18 DIAGNOSIS — E559 Vitamin D deficiency, unspecified: Secondary | ICD-10-CM | POA: Diagnosis not present

## 2016-02-18 DIAGNOSIS — I10 Essential (primary) hypertension: Secondary | ICD-10-CM | POA: Diagnosis not present

## 2016-02-23 DIAGNOSIS — Z1212 Encounter for screening for malignant neoplasm of rectum: Secondary | ICD-10-CM | POA: Diagnosis not present

## 2016-04-24 DIAGNOSIS — Z23 Encounter for immunization: Secondary | ICD-10-CM | POA: Diagnosis not present

## 2016-09-04 DIAGNOSIS — Z1231 Encounter for screening mammogram for malignant neoplasm of breast: Secondary | ICD-10-CM | POA: Diagnosis not present

## 2016-11-23 DIAGNOSIS — D1801 Hemangioma of skin and subcutaneous tissue: Secondary | ICD-10-CM | POA: Diagnosis not present

## 2016-11-23 DIAGNOSIS — L814 Other melanin hyperpigmentation: Secondary | ICD-10-CM | POA: Diagnosis not present

## 2016-11-23 DIAGNOSIS — L603 Nail dystrophy: Secondary | ICD-10-CM | POA: Diagnosis not present

## 2016-11-23 DIAGNOSIS — L570 Actinic keratosis: Secondary | ICD-10-CM | POA: Diagnosis not present

## 2016-11-23 DIAGNOSIS — L853 Xerosis cutis: Secondary | ICD-10-CM | POA: Diagnosis not present

## 2016-11-23 DIAGNOSIS — L821 Other seborrheic keratosis: Secondary | ICD-10-CM | POA: Diagnosis not present

## 2016-11-23 DIAGNOSIS — L602 Onychogryphosis: Secondary | ICD-10-CM | POA: Diagnosis not present

## 2016-11-23 DIAGNOSIS — L820 Inflamed seborrheic keratosis: Secondary | ICD-10-CM | POA: Diagnosis not present

## 2016-12-06 DIAGNOSIS — H4303 Vitreous prolapse, bilateral: Secondary | ICD-10-CM | POA: Diagnosis not present

## 2017-02-12 DIAGNOSIS — M859 Disorder of bone density and structure, unspecified: Secondary | ICD-10-CM | POA: Diagnosis not present

## 2017-02-12 DIAGNOSIS — I10 Essential (primary) hypertension: Secondary | ICD-10-CM | POA: Diagnosis not present

## 2017-02-19 DIAGNOSIS — Z6822 Body mass index (BMI) 22.0-22.9, adult: Secondary | ICD-10-CM | POA: Diagnosis not present

## 2017-02-19 DIAGNOSIS — Z1389 Encounter for screening for other disorder: Secondary | ICD-10-CM | POA: Diagnosis not present

## 2017-02-19 DIAGNOSIS — I10 Essential (primary) hypertension: Secondary | ICD-10-CM | POA: Diagnosis not present

## 2017-02-19 DIAGNOSIS — E559 Vitamin D deficiency, unspecified: Secondary | ICD-10-CM | POA: Diagnosis not present

## 2017-02-19 DIAGNOSIS — Z1212 Encounter for screening for malignant neoplasm of rectum: Secondary | ICD-10-CM | POA: Diagnosis not present

## 2017-02-19 DIAGNOSIS — E282 Polycystic ovarian syndrome: Secondary | ICD-10-CM | POA: Diagnosis not present

## 2017-02-19 DIAGNOSIS — M859 Disorder of bone density and structure, unspecified: Secondary | ICD-10-CM | POA: Diagnosis not present

## 2017-02-19 DIAGNOSIS — Z0000 Encounter for general adult medical examination without abnormal findings: Secondary | ICD-10-CM | POA: Diagnosis not present

## 2017-02-20 DIAGNOSIS — Z6823 Body mass index (BMI) 23.0-23.9, adult: Secondary | ICD-10-CM | POA: Diagnosis not present

## 2017-02-20 DIAGNOSIS — Z124 Encounter for screening for malignant neoplasm of cervix: Secondary | ICD-10-CM | POA: Diagnosis not present

## 2017-02-20 DIAGNOSIS — Z1272 Encounter for screening for malignant neoplasm of vagina: Secondary | ICD-10-CM | POA: Diagnosis not present

## 2017-04-13 DIAGNOSIS — Z23 Encounter for immunization: Secondary | ICD-10-CM | POA: Diagnosis not present

## 2017-08-22 DIAGNOSIS — M859 Disorder of bone density and structure, unspecified: Secondary | ICD-10-CM | POA: Diagnosis not present

## 2017-09-11 DIAGNOSIS — Z1231 Encounter for screening mammogram for malignant neoplasm of breast: Secondary | ICD-10-CM | POA: Diagnosis not present

## 2018-01-30 DIAGNOSIS — L570 Actinic keratosis: Secondary | ICD-10-CM | POA: Diagnosis not present

## 2018-01-30 DIAGNOSIS — D22111 Melanocytic nevi of right upper eyelid, including canthus: Secondary | ICD-10-CM | POA: Diagnosis not present

## 2018-01-30 DIAGNOSIS — L821 Other seborrheic keratosis: Secondary | ICD-10-CM | POA: Diagnosis not present

## 2018-01-30 DIAGNOSIS — L814 Other melanin hyperpigmentation: Secondary | ICD-10-CM | POA: Diagnosis not present

## 2018-02-13 DIAGNOSIS — R82998 Other abnormal findings in urine: Secondary | ICD-10-CM | POA: Diagnosis not present

## 2018-02-13 DIAGNOSIS — I10 Essential (primary) hypertension: Secondary | ICD-10-CM | POA: Diagnosis not present

## 2018-02-13 DIAGNOSIS — E559 Vitamin D deficiency, unspecified: Secondary | ICD-10-CM | POA: Diagnosis not present

## 2018-02-19 DIAGNOSIS — E282 Polycystic ovarian syndrome: Secondary | ICD-10-CM | POA: Diagnosis not present

## 2018-02-19 DIAGNOSIS — E559 Vitamin D deficiency, unspecified: Secondary | ICD-10-CM | POA: Diagnosis not present

## 2018-02-19 DIAGNOSIS — Z6822 Body mass index (BMI) 22.0-22.9, adult: Secondary | ICD-10-CM | POA: Diagnosis not present

## 2018-02-19 DIAGNOSIS — I10 Essential (primary) hypertension: Secondary | ICD-10-CM | POA: Diagnosis not present

## 2018-02-19 DIAGNOSIS — M859 Disorder of bone density and structure, unspecified: Secondary | ICD-10-CM | POA: Diagnosis not present

## 2018-02-19 DIAGNOSIS — Z1389 Encounter for screening for other disorder: Secondary | ICD-10-CM | POA: Diagnosis not present

## 2018-02-19 DIAGNOSIS — Z0000 Encounter for general adult medical examination without abnormal findings: Secondary | ICD-10-CM | POA: Diagnosis not present

## 2018-03-20 DIAGNOSIS — Z01419 Encounter for gynecological examination (general) (routine) without abnormal findings: Secondary | ICD-10-CM | POA: Diagnosis not present

## 2018-03-20 DIAGNOSIS — Z6823 Body mass index (BMI) 23.0-23.9, adult: Secondary | ICD-10-CM | POA: Diagnosis not present

## 2018-04-08 DIAGNOSIS — Z1211 Encounter for screening for malignant neoplasm of colon: Secondary | ICD-10-CM | POA: Diagnosis not present

## 2018-04-08 DIAGNOSIS — K5730 Diverticulosis of large intestine without perforation or abscess without bleeding: Secondary | ICD-10-CM | POA: Diagnosis not present

## 2018-05-11 DIAGNOSIS — Z23 Encounter for immunization: Secondary | ICD-10-CM | POA: Diagnosis not present

## 2018-09-17 DIAGNOSIS — Z803 Family history of malignant neoplasm of breast: Secondary | ICD-10-CM | POA: Diagnosis not present

## 2018-09-17 DIAGNOSIS — Z1231 Encounter for screening mammogram for malignant neoplasm of breast: Secondary | ICD-10-CM | POA: Diagnosis not present

## 2019-02-02 DIAGNOSIS — M13871 Other specified arthritis, right ankle and foot: Secondary | ICD-10-CM | POA: Diagnosis not present

## 2019-02-18 DIAGNOSIS — E559 Vitamin D deficiency, unspecified: Secondary | ICD-10-CM | POA: Diagnosis not present

## 2019-02-18 DIAGNOSIS — I10 Essential (primary) hypertension: Secondary | ICD-10-CM | POA: Diagnosis not present

## 2019-03-07 DIAGNOSIS — E282 Polycystic ovarian syndrome: Secondary | ICD-10-CM | POA: Diagnosis not present

## 2019-03-07 DIAGNOSIS — E559 Vitamin D deficiency, unspecified: Secondary | ICD-10-CM | POA: Diagnosis not present

## 2019-03-07 DIAGNOSIS — M8580 Other specified disorders of bone density and structure, unspecified site: Secondary | ICD-10-CM | POA: Diagnosis not present

## 2019-03-07 DIAGNOSIS — H269 Unspecified cataract: Secondary | ICD-10-CM | POA: Diagnosis not present

## 2019-03-07 DIAGNOSIS — Z1331 Encounter for screening for depression: Secondary | ICD-10-CM | POA: Diagnosis not present

## 2019-03-07 DIAGNOSIS — I10 Essential (primary) hypertension: Secondary | ICD-10-CM | POA: Diagnosis not present

## 2019-03-07 DIAGNOSIS — Z0000 Encounter for general adult medical examination without abnormal findings: Secondary | ICD-10-CM | POA: Diagnosis not present

## 2019-03-13 DIAGNOSIS — L603 Nail dystrophy: Secondary | ICD-10-CM | POA: Diagnosis not present

## 2019-03-13 DIAGNOSIS — L814 Other melanin hyperpigmentation: Secondary | ICD-10-CM | POA: Diagnosis not present

## 2019-03-13 DIAGNOSIS — L821 Other seborrheic keratosis: Secondary | ICD-10-CM | POA: Diagnosis not present

## 2019-03-19 ENCOUNTER — Ambulatory Visit (INDEPENDENT_AMBULATORY_CARE_PROVIDER_SITE_OTHER): Payer: Medicare Other | Admitting: Cardiovascular Disease

## 2019-03-19 ENCOUNTER — Encounter: Payer: Self-pay | Admitting: Cardiovascular Disease

## 2019-03-19 ENCOUNTER — Other Ambulatory Visit: Payer: Self-pay

## 2019-03-19 DIAGNOSIS — E782 Mixed hyperlipidemia: Secondary | ICD-10-CM

## 2019-03-19 DIAGNOSIS — I10 Essential (primary) hypertension: Secondary | ICD-10-CM

## 2019-03-19 DIAGNOSIS — E785 Hyperlipidemia, unspecified: Secondary | ICD-10-CM | POA: Insufficient documentation

## 2019-03-19 NOTE — Patient Instructions (Addendum)
Medication Instructions:  Your physician recommends that you continue on your current medications as directed. Please refer to the Current Medication list given to you today.  If you need a refill on your cardiac medications before your next appointment, please call your pharmacy.   Lab work: NONE If you have labs (blood work) drawn today and your tests are completely normal, you will receive your results only by: Marland Kitchen MyChart Message (if you have MyChart) OR . A paper copy in the mail If you have any lab test that is abnormal or we need to change your treatment, we will call you to review the results.  Testing/Procedures: NONE  Follow-Up: At [facility], you and your health needs are our priority.  As part of our continuing mission to provide you with exceptional heart care, we have created designated Provider Care Teams.  These Care Teams include your primary Cardiologist (physician) and Advanced Practice Providers (APPs -  Physician Assistants and Nurse Practitioners) who all work together to provide you with the care you need, when you need it. You will need a follow up appointment in 12 months with Dr. Quay Burow.  Please call our office 2 months in advance to schedule this appointment.    ADDITIONAL INFORMATION: KEEP A BLOOD PRESSURE LOG FOR 30 DAYS THEN FOLLOW UP WITH A CLINICAL PHARMACIST IN THE HYPERTENSION CLINIC. YOU WILL NEED AN APPOINTMENT.

## 2019-03-19 NOTE — Progress Notes (Signed)
03/19/2019 Maria Gilmore   1940/11/23  [id number]  Primary Physician Velna Hatchet, MD Primary Cardiologist: Lorretta Harp MD [name], [place]  HPI:  Maria Gilmore is a 78 y.o.  thin-appearing liver Caucasian female, mother of 2 children, grandmother of 5 grandchildren who I initially saw at the request of Dr. Vernice Jefferson 06/26/11. I last saw her in the office 06/01/2015. She has a history of hypertension and polycystic ovarian disease on metformin. She had a Myoview stress test performed 08/26/12 which is entirely normal.   Since I saw her 4 years ago she is remained stable.  She does live alone and is independent.  She denies chest pain or shortness of breath.  Current Meds  Medication Sig  . cholecalciferol (VITAMIN D) 1000 UNITS tablet Take 1,000 Units by mouth daily.  . hydrochlorothiazide (HYDRODIURIL) 25 MG tablet Take 25 mg by mouth daily.   Marland Kitchen losartan (COZAAR) 25 MG tablet Take 25 mg by mouth daily.  . metFORMIN (GLUCOPHAGE) 1000 MG tablet Take 1,000 mg by mouth 2 (two) times daily with a meal.  . vitamin E 100 UNIT capsule Take 100 Units by mouth daily.     Allergies  Allergen Reactions  . Kenalog [Triamcinolone]   . Sulfa Antibiotics     Social History   Socioeconomic History  . Marital status: Married    Spouse name: Not on file  . Number of children: Not on file  . Years of education: Not on file  . Highest education level: Not on file  Occupational History  . Not on file  Social Needs  . Financial resource strain: Not on file  . Food insecurity    Worry: Not on file    Inability: Not on file  . Transportation needs    Medical: Not on file    Non-medical: Not on file  Tobacco Use  . Smoking status: Never Smoker  . Smokeless tobacco: Never Used  Substance and Sexual Activity  . Alcohol use: No  . Drug use: No  . Sexual activity: Not on file  Lifestyle  . Physical activity    Days per week: Not on file    Minutes per session: Not on  file  . Stress: Not on file  Relationships  . Social Herbalist on phone: Not on file    Gets together: Not on file    Attends religious service: Not on file    Active member of club or organization: Not on file    Attends meetings of clubs or organizations: Not on file    Relationship status: Not on file  . Intimate partner violence    Fear of current or ex partner: Not on file    Emotionally abused: Not on file    Physically abused: Not on file    Forced sexual activity: Not on file  Other Topics Concern  . Not on file  Social History Narrative  . Not on file     Review of Systems: General: negative for chills, fever, night sweats or weight changes.  Cardiovascular: negative for chest pain, dyspnea on exertion, edema, orthopnea, palpitations, paroxysmal nocturnal dyspnea or shortness of breath Dermatological: negative for rash Respiratory: negative for cough or wheezing Urologic: negative for hematuria Abdominal: negative for nausea, vomiting, diarrhea, bright red blood per rectum, melena, or hematemesis Neurologic: negative for visual changes, syncope, or dizziness All other systems reviewed and are otherwise negative except as noted above.  Blood pressure (!) 168/80, pulse 66, height 5\' 2"  (1.575 m), weight 122 lb (55.3 kg).  General appearance: alert and no distress Neck: no adenopathy, no carotid bruit, no JVD, supple, symmetrical, trachea midline and thyroid not enlarged, symmetric, no tenderness/mass/nodules Lungs: clear to auscultation bilaterally Heart: regular rate and rhythm, S1, S2 normal, no murmur, click, rub or gallop Extremities: extremities normal, atraumatic, no cyanosis or edema Pulses: 2+ and symmetric Skin: Skin color, texture, turgor normal. No rashes or lesions Neurologic: Alert and oriented X 3, normal strength and tone. Normal symmetric reflexes. Normal coordination and gait  EKG sinus rhythm at 66 with occasional PVCs and nonspecific  ST and T wave changes.  I personally reviewed this EKG  ASSESSMENT AND PLAN:   HTN (hypertension) History of essential hypertension with blood pressure measured at 168/80.  She is on losartan and hydrochlorothiazide.  Hyperlipidemia Recent lipid profile performed 02/18/2019 revealed total cholesterol 180, LDL of 116 and HDL of 51 not on statin therapy.      Lorretta Harp MD FACP,FACC,FAHA, [facility] 03/19/2019 2:04 PM

## 2019-03-19 NOTE — Assessment & Plan Note (Signed)
History of essential hypertension with blood pressure measured at 168/80.  She is on losartan and hydrochlorothiazide.

## 2019-03-19 NOTE — Assessment & Plan Note (Signed)
Recent lipid profile performed 02/18/2019 revealed total cholesterol 180, LDL of 116 and HDL of 51 not on statin therapy.

## 2019-03-21 NOTE — Addendum Note (Signed)
Addended by: Crissie Reese on: 03/21/2019 03:12 PM   Modules accepted: Orders

## 2019-03-31 DIAGNOSIS — Z124 Encounter for screening for malignant neoplasm of cervix: Secondary | ICD-10-CM | POA: Diagnosis not present

## 2019-03-31 DIAGNOSIS — Z01419 Encounter for gynecological examination (general) (routine) without abnormal findings: Secondary | ICD-10-CM | POA: Diagnosis not present

## 2019-04-24 ENCOUNTER — Ambulatory Visit: Payer: Medicare Other | Admitting: Pharmacist

## 2019-04-24 ENCOUNTER — Other Ambulatory Visit: Payer: Self-pay

## 2019-04-24 VITALS — BP 160/82 | HR 78 | Resp 14 | Ht 62.0 in | Wt 122.4 lb

## 2019-04-24 DIAGNOSIS — I10 Essential (primary) hypertension: Secondary | ICD-10-CM | POA: Diagnosis not present

## 2019-04-24 MED ORDER — VALSARTAN 80 MG PO TABS: 80.0000 mg | ORAL_TABLET | Freq: Every day | ORAL | 1 refills | Status: DC

## 2019-04-24 NOTE — Assessment & Plan Note (Signed)
BP remains above goal of <130/80. Patient denies symptoms with elevated BP and continues to follow positive lifestyle modifications. Will discontinue losartan 25mg  , start valsartan 80mg  daily, and work on lowering sodium in diet. Patient to check BP twice daily and bring records to follow up visit in 1 months. Plan to repeat BMET 1 week before appointment and change HCTZ to chlorthalidone is renal function remains stable and additional BP control needed.

## 2019-04-24 NOTE — Patient Instructions (Addendum)
Return for a  follow up appointment in 4 weeks  Go to the lab in 1 week before next appointmnet  Check your blood pressure at home daily (if able) and keep record of the readings.  Take your BP meds as follows: *DECREASE salt in diet*  *STOP taking losartan 25mg * *START taking valsartan 80mg  every daily*  Bring all of your meds, your BP cuff and your record of home blood pressures to your next appointment.  Exercise as you're able, try to walk approximately 30 minutes per day.  Keep salt intake to a minimum, especially watch canned and prepared boxed foods.  Eat more fresh fruits and vegetables and fewer canned items.  Avoid eating in fast food restaurants.    HOW TO TAKE YOUR BLOOD PRESSURE: . Rest 5 minutes before taking your blood pressure. .  Don't smoke or drink caffeinated beverages for at least 30 minutes before. . Take your blood pressure before (not after) you eat. . Sit comfortably with your back supported and both feet on the floor (don't cross your legs). . Elevate your arm to heart level on a table or a desk. . Use the proper sized cuff. It should fit smoothly and snugly around your bare upper arm. There should be enough room to slip a fingertip under the cuff. The bottom edge of the cuff should be 1 inch above the crease of the elbow. . Ideally, take 3 measurements at one sitting and record the average.

## 2019-04-24 NOTE — Progress Notes (Signed)
Patient ID: Cha Lockette                 DOB: 1941/07/06                      MRN: [id number]     HPI:  Maria Gilmore is a 78 y.o. female referred by Dr. Gwenlyn Found to HTN clinic. PMH includes hypertension for about 10 years, polycystic ovarian disease, and hyperlipidemia. Patient denies headaches, dizziness, blurry vision or chest pain. She reports compliance with current medication therapy and with low carbohydrate diet.  Current HTN meds:  HCTZ 25mg  daily in AM Losartan 25mg  qHS   BP goal: 130/80  Family History: HTN in father;   Social History: denies tobacco or alcohol use  Diet: minimal home cooked meals, 1-2 coffee per day, tuna salad, cabbaged salad, fruits, protein bars  Exercise: walks every day if not raining (3-4 miles 3 to 4 times per week)  Home BP readings:  15 readings: average 160/74 (HR 65-82 bpm)  Wt Readings from Last 3 Encounters:  04/24/19 122 lb 6.4 oz (55.5 kg)  03/19/19 122 lb (55.3 kg)  06/01/15 127 lb (57.6 kg)   BP Readings from Last 3 Encounters:  04/24/19 (!) 160/82  03/19/19 (!) 163/86  06/01/15 (!) 152/78   Pulse Readings from Last 3 Encounters:  04/24/19 78  03/19/19 72  06/01/15 68    Past Medical History:  Diagnosis Date  . Hypertension   . Polycystic disease, ovaries   . Swelling of left lower extremity 11/28/2011   LEV-no evidence of dvt,super. thrombosis    Current Outpatient Medications on File Prior to Visit  Medication Sig Dispense Refill  . cholecalciferol (VITAMIN D) 1000 UNITS tablet Take 1,000 Units by mouth daily.    . hydrochlorothiazide (HYDRODIURIL) 25 MG tablet Take 25 mg by mouth daily.     . metFORMIN (GLUCOPHAGE) 1000 MG tablet Take 1,000 mg by mouth 2 (two) times daily with a meal.    . vitamin E 100 UNIT capsule Take 100 Units by mouth daily.     No current facility-administered medications on file prior to visit.     Allergies  Allergen Reactions  . Kenalog [Triamcinolone]   . Sulfa Antibiotics     Blood  pressure (!) 160/82, pulse 78, resp. rate 14, height 5\' 2"  (1.575 m), weight 122 lb 6.4 oz (55.5 kg), SpO2 98 %.  HTN (hypertension) BP remains above goal of <130/80. Patient denies symptoms with elevated BP and continues to follow positive lifestyle modifications. Will discontinue losartan 25mg  , start valsartan 80mg  daily, and work on lowering sodium in diet. Patient to check BP twice daily and bring records to follow up visit in 1 months. Plan to repeat BMET 1 week before appointment and change HCTZ to chlorthalidone is renal function remains stable and additional BP control needed.     Annaston Upham Rodriguez-Guzman PharmD, BCPS, [place] [id number] 04/24/2019 6:58 PM

## 2019-05-15 DIAGNOSIS — I10 Essential (primary) hypertension: Secondary | ICD-10-CM | POA: Diagnosis not present

## 2019-05-16 ENCOUNTER — Encounter: Payer: Self-pay | Admitting: *Deleted

## 2019-05-16 ENCOUNTER — Other Ambulatory Visit: Payer: Self-pay | Admitting: Cardiovascular Disease

## 2019-05-16 LAB — BASIC METABOLIC PANEL
BUN/Creatinine Ratio: 18 (ref 12–28)
BUN: 12 mg/dL (ref 8–27)
CO2: 25 mmol/L (ref 20–29)
Calcium: 10.1 mg/dL (ref 8.7–10.3)
Chloride: 92 mmol/L — ABNORMAL LOW (ref 96–106)
Creatinine, Ser: 0.67 mg/dL (ref 0.57–1.00)
GFR calc Af Amer: 99 mL/min/{1.73_m2} (ref >59)
GFR calc non Af Amer: 86 mL/min/{1.73_m2} (ref >59)
Glucose: 87 mg/dL (ref 65–99)
Potassium: 4.3 mmol/L (ref 3.5–5.2)
Sodium: 132 mmol/L — ABNORMAL LOW (ref 134–144)

## 2019-05-22 ENCOUNTER — Ambulatory Visit: Payer: Medicare Other

## 2019-05-22 ENCOUNTER — Telehealth: Payer: Self-pay | Admitting: Cardiovascular Disease

## 2019-05-22 MED ORDER — VALSARTAN 80 MG PO TABS: 120.0000 mg | ORAL_TABLET | Freq: Every day | ORAL | 1 refills | Status: DC

## 2019-05-22 NOTE — Telephone Encounter (Signed)
BP  11/12: 151/68 (68) 11/11: 141/62 (71) ;  131/62 (67) 11/10: 152/59 ; 162/68   Denies dizziness, swelling, HA's or blurry vision. Walking 4 miles 4-5x/week.

## 2019-05-22 NOTE — Telephone Encounter (Signed)
New Message      Pt is calling and would like Raquel to call her back, she says she would like to discuss her BP     Please call

## 2019-05-27 ENCOUNTER — Telehealth: Payer: Self-pay | Admitting: Pharmacist

## 2019-05-27 NOTE — Telephone Encounter (Signed)
Question about her recent blood work. Doing well with current BP medication.  Recent reading of 115/60 HR 65 pulse - no symptoms   Will continue current therapy for now

## 2019-06-18 ENCOUNTER — Other Ambulatory Visit: Payer: Self-pay

## 2019-06-18 MED ORDER — VALSARTAN 80 MG PO TABS: 120.0000 mg | ORAL_TABLET | Freq: Every day | ORAL | 10 refills | Status: DC

## 2019-06-23 ENCOUNTER — Telehealth: Payer: Self-pay | Admitting: Cardiovascular Disease

## 2019-06-23 NOTE — Telephone Encounter (Signed)
Called and spoke w/pt and explained that we do not offer virtuals for htn pharmacy visits. The pt voiced understanding and stated that she can come in person at her designated appt time

## 2019-06-23 NOTE — Telephone Encounter (Signed)
Patient wanted to know if her appt with the Pharmacy on 12/17 could be done virtually. If not, she does not mind coming in, but doing the appointment virtually could save her a trip to [place].

## 2019-06-26 ENCOUNTER — Ambulatory Visit (INDEPENDENT_AMBULATORY_CARE_PROVIDER_SITE_OTHER): Payer: Medicare Other | Admitting: Pharmacist

## 2019-06-26 ENCOUNTER — Other Ambulatory Visit: Payer: Self-pay

## 2019-06-26 ENCOUNTER — Encounter (INDEPENDENT_AMBULATORY_CARE_PROVIDER_SITE_OTHER): Payer: Self-pay

## 2019-06-26 VITALS — BP 138/76 | HR 70 | Resp 16 | Ht 62.0 in | Wt 117.0 lb

## 2019-06-26 DIAGNOSIS — I10 Essential (primary) hypertension: Secondary | ICD-10-CM

## 2019-06-26 NOTE — Patient Instructions (Addendum)
Return for a  follow up appointment ( 2 months by phone)  Check your blood pressure at home daily (if able) and keep record of the readings.  Take your BP meds as follows: *TAKE VALSARTAN 80mg  at bedtime and 40mg  every morning* *CONTINUE all other medication as previously prescribed*  Bring all of your meds, your BP cuff and your record of home blood pressures to your next appointment.  Exercise as you're able, try to walk approximately 30 minutes per day.  Keep salt intake to a minimum, especially watch canned and prepared boxed foods.  Eat more fresh fruits and vegetables and fewer canned items.  Avoid eating in fast food restaurants.    HOW TO TAKE YOUR BLOOD PRESSURE: . Rest 5 minutes before taking your blood pressure. .  Don't smoke or drink caffeinated beverages for at least 30 minutes before. . Take your blood pressure before (not after) you eat. . Sit comfortably with your back supported and both feet on the floor (don't cross your legs). . Elevate your arm to heart level on a table or a desk. . Use the proper sized cuff. It should fit smoothly and snugly around your bare upper arm. There should be enough room to slip a fingertip under the cuff. The bottom edge of the cuff should be 1 inch above the crease of the elbow. . Ideally, take 3 measurements at one sitting and record the average.

## 2019-06-26 NOTE — Progress Notes (Signed)
Patient ID: Maria Gilmore                 DOB: 10-29-1940                      MRN: [id number]     HPI:  Maria Gilmore is a 78 y.o. female referred by Dr. Gwenlyn Found to HTN clinic. PMH includes hypertension for about 10 years, polycystic ovarian disease, and hyperlipidemia. Patient denies headaches, dizziness, blurry vision or chest pain. She reports compliance with current medication therapy and with low carbohydrate diet.  Current HTN meds:  HCTZ 25mg  daily in AM Valsartan 80mg  tabs ;take 1 and 1/2 tablet = 120mg  daily at bedtime   BP goal: 130/80  Family History: HTN in father  Social History: denies tobacco or alcohol use  Diet: minimal home cooked meals, 1-2 coffee per day, tuna salad, cabbaged salad, fruits, protein bars  Exercise: walks every day if not raining (3-4 miles 3 to 4 times per week)  Home BP readings:  Home arm cuff accurate within less than 11mmHg difference form manual reading 20 readings; average 126/62 ; HR 62-76bpm  Wt Readings from Last 3 Encounters:  06/26/19 117 lb (53.1 kg)  04/24/19 122 lb 6.4 oz (55.5 kg)  03/19/19 122 lb (55.3 kg)   BP Readings from Last 3 Encounters:  06/26/19 138/76  04/24/19 (!) 160/82  03/19/19 (!) 163/86   Pulse Readings from Last 3 Encounters:  06/26/19 70  04/24/19 78  03/19/19 72    Past Medical History:  Diagnosis Date  . Hypertension   . Polycystic disease, ovaries   . Swelling of left lower extremity 11/28/2011   LEV-no evidence of dvt,super. thrombosis    Current Outpatient Medications on File Prior to Visit  Medication Sig Dispense Refill  . cholecalciferol (VITAMIN D) 1000 UNITS tablet Take 1,000 Units by mouth daily.    . hydrochlorothiazide (HYDRODIURIL) 25 MG tablet Take 25 mg by mouth daily.     . metFORMIN (GLUCOPHAGE) 1000 MG tablet Take 1,000 mg by mouth 2 (two) times daily with a meal.    . valsartan (DIOVAN) 80 MG tablet Take 1.5 tablets (120 mg total) by mouth at bedtime. 45 tablet 10  . vitamin E  100 UNIT capsule Take 100 Units by mouth daily.     No current facility-administered medications on file prior to visit.    Allergies  Allergen Reactions  . Kenalog [Triamcinolone]   . Sulfa Antibiotics     Blood pressure 138/76, pulse 70, resp. rate 16, height 5\' 2"  (1.575 m), weight 117 lb (53.1 kg).  HTN (hypertension) Blood pressure is slightly above goal during office visit ,but greatly improved since last office visit. Home BP average of 126/62 is at desired goal.  Will continue valsartan 120mg  daily but split dose to 40mg  in Am and 80mg  at bedtime per patient's request.  Plan follow up by phone in 2 months and increase valsartan to 80mg  BID if additional BP control needed.   Bray Vickerman Rodriguez-Guzman PharmD, BCPS, [place] [address] 07/10/2019 2:22 PM

## 2019-07-10 ENCOUNTER — Encounter: Payer: Self-pay | Admitting: Pharmacist

## 2019-07-10 NOTE — Assessment & Plan Note (Signed)
Blood pressure is slightly above goal during office visit ,but greatly improved since last office visit. Home BP average of 126/62 is at desired goal.  Will continue valsartan 120mg  daily but split dose to 40mg  in Am and 80mg  at bedtime per patient's request.  Plan follow up by phone in 2 months and increase valsartan to 80mg  BID if additional BP control needed.

## 2019-09-03 DIAGNOSIS — H33322 Round hole, left eye: Secondary | ICD-10-CM | POA: Diagnosis not present

## 2019-09-03 DIAGNOSIS — H43393 Other vitreous opacities, bilateral: Secondary | ICD-10-CM | POA: Diagnosis not present

## 2019-11-10 DIAGNOSIS — Z1231 Encounter for screening mammogram for malignant neoplasm of breast: Secondary | ICD-10-CM | POA: Diagnosis not present

## 2019-12-09 ENCOUNTER — Telehealth: Payer: Self-pay | Admitting: Cardiovascular Disease

## 2019-12-09 NOTE — Telephone Encounter (Signed)
Left message for patient to return call.

## 2019-12-09 NOTE — Telephone Encounter (Signed)
Pt c/o medication issue:  1. Name of Medication: valsartan (DIOVAN) 80 MG tablet  2. How are you currently taking this medication (dosage and times per day)? 1.5 tablet at bedtime  3. Are you having a reaction (difficulty breathing--STAT)? no  4. What is your medication issue? Patient states she has been feeling dizzy and having cramps in her legs. She states she fell and hit her head on a chair, but she is not sure if it was because of the dizziness. She states she may have stepped wrong. She states she fell around the time she started the medication. She states she has not fainted and that she is still able to walk on days the weather is nice. She states she was taking losartan before and is not sure if this new medication is what is causing her symptoms.

## 2019-12-11 NOTE — Telephone Encounter (Signed)
Left message for pt to call.

## 2019-12-15 NOTE — Telephone Encounter (Signed)
Attempted to contact patient-  Went to voicemail, left message to call back to discuss if still needed.  After third attempt will remove from triage pool.

## 2020-01-15 ENCOUNTER — Telehealth: Payer: Self-pay | Admitting: Cardiovascular Disease

## 2020-01-15 NOTE — Telephone Encounter (Signed)
Spoke with pt, she feels the valsartan 120 mg is too much. She is having occ dizziness when standing and after taking the valsartan she will be very tired and sleepy. She has lost weight and her bp is running 130/59. She was given the okay to cut the valsartan down to 80 mg to see if that will hold her bp and help with her dizziness. Follow up scheduled per recall for dr berry. She will track her bp once daily and bring that to her follow up appointment. She will call back with conerns.

## 2020-01-15 NOTE — Telephone Encounter (Signed)
    Pt c/o medication issue:  1. Name of Medication: valsartan (DIOVAN) 80 MG tablet  2. How are you currently taking this medication (dosage and times per day)?  Take 1.5 tablets (120 mg total) by mouth at bedtime.  3. Are you having a reaction (difficulty breathing--STAT)?   4. What is your medication issue? Pt is calling back with her concern last month phone note 12/09/2019. She said she accidentally block our # and she apologized. She said she still feeling feeling the same issue she have last time

## 2020-02-12 ENCOUNTER — Other Ambulatory Visit: Payer: Self-pay | Admitting: Cardiovascular Disease

## 2020-02-16 ENCOUNTER — Telehealth: Payer: Self-pay | Admitting: Cardiovascular Disease

## 2020-02-16 NOTE — Telephone Encounter (Signed)
Spoke to patient's daughter Anderson Malta.Stated PCP changed Losartan to Valsartan.Stated since the change she has noticed mother is confused at times and urinating frequently.Advised to call PCP for advice.I will make Dr.Berry aware.Advised to keep appointment already scheduled with Dr.Berry 9/29 at 10:30 am.

## 2020-02-16 NOTE — Telephone Encounter (Signed)
Pt c/o medication issue:  1. Name of Medication: valsartan (DIOVAN) 80 MG tablet  2. How are you currently taking this medication (dosage and times per day)? As directed   3. Are you having a reaction (difficulty breathing--STAT)? No  4. What is your medication issue? Patient's daughter states the medication is causing confusion and dehydration. Please return call to discuss.

## 2020-02-18 NOTE — Telephone Encounter (Signed)
Returned call to patient's daughter [name] Kristin's advice given.She will monitor B/P daily and bring readings to appointment with Kerin Ransom PA 8/31 at 8:84 pm.If systolic B/P goes over 166 more than once she will call before appointment.

## 2020-02-18 NOTE — Telephone Encounter (Signed)
Spoke to patient's daughter Anderson Malta.She stated mother has been complaining after she takes Valsartan she feels strange.Stated her and her sister have noticed a change in mother since Christmas,she seems confused at times and does not remember things as well.No chest pain.No sob.After reviewing chart patient was taking Valsartan at her last appointment with pharmacist 06/26/19.Stated she read that Valsartan can cause confusion. Pharmacist advised follow up in 2 months.Appointment scheduled with Kerin Ransom PA 8/31 at 3:45 pm.I will send message to our pharmacist for advice.

## 2020-02-18 NOTE — Telephone Encounter (Signed)
Follow up   [name] pt's daughter calling to follow up, she said if Maria Gilmore can call her instead since she is living right beside her Mother

## 2020-02-18 NOTE — Telephone Encounter (Signed)
Follow up:     Patient daughter returning call back concering some medication changes. Patient called primary doctor and stated HC made the change.

## 2020-02-18 NOTE — Telephone Encounter (Signed)
Patient has been on valsartan since October, don't usually see those side effects from it.  Do we know what her home BP readings have been?  I would suggest stopping valsartan for now, she is scheduled to see Lurena Joiner in 3 weeks.  Have her monitory BP daily (or at lease 4-5 times per week) and bring that information when she sees [place].  If home BP goes over 160 on more than 1 occasion prior to appointment, they need to call in.

## 2020-02-26 ENCOUNTER — Telehealth: Payer: Self-pay | Admitting: Cardiovascular Disease

## 2020-02-26 NOTE — Telephone Encounter (Signed)
Patient's daughter is calling to follow up regarding BP.

## 2020-02-26 NOTE — Telephone Encounter (Signed)
New message:    Patient daughter calling concering her mothers BP.

## 2020-02-26 NOTE — Telephone Encounter (Signed)
Spoke to patient's daughter [name].She stated since mother stopped taking Valsartan B/P has been elevated.Today B/P 163/85 did not check pulse.B/P has been ranging 140 to 150's / 80's.Stated mother has been confused and they thought Valsartan causing confusion.Stated she was still confused after stopping.She will restart Valsartan 80 mg every night.She will continue to monitor B/P and bring readings to appointment already scheduled with Kerin Ransom PA 8/31 at 3:45 pm.

## 2020-03-08 DIAGNOSIS — E282 Polycystic ovarian syndrome: Secondary | ICD-10-CM | POA: Diagnosis not present

## 2020-03-08 DIAGNOSIS — Z7689 Persons encountering health services in other specified circumstances: Secondary | ICD-10-CM | POA: Diagnosis not present

## 2020-03-08 DIAGNOSIS — R413 Other amnesia: Secondary | ICD-10-CM | POA: Diagnosis not present

## 2020-03-08 DIAGNOSIS — I10 Essential (primary) hypertension: Secondary | ICD-10-CM | POA: Diagnosis not present

## 2020-03-08 DIAGNOSIS — R410 Disorientation, unspecified: Secondary | ICD-10-CM | POA: Diagnosis not present

## 2020-03-08 DIAGNOSIS — Z6820 Body mass index (BMI) 20.0-20.9, adult: Secondary | ICD-10-CM | POA: Diagnosis not present

## 2020-03-09 ENCOUNTER — Ambulatory Visit (INDEPENDENT_AMBULATORY_CARE_PROVIDER_SITE_OTHER): Payer: Medicare Other | Admitting: Cardiology

## 2020-03-09 ENCOUNTER — Encounter: Payer: Self-pay | Admitting: Cardiology

## 2020-03-09 ENCOUNTER — Other Ambulatory Visit: Payer: Self-pay

## 2020-03-09 VITALS — BP 148/82 | HR 68 | Ht 62.0 in | Wt 112.6 lb

## 2020-03-09 DIAGNOSIS — E282 Polycystic ovarian syndrome: Secondary | ICD-10-CM

## 2020-03-09 DIAGNOSIS — I10 Essential (primary) hypertension: Secondary | ICD-10-CM | POA: Diagnosis not present

## 2020-03-09 NOTE — Progress Notes (Signed)
Cardiology Office Note:    Date:  03/09/2020   ID:  Maralyn Witherell, DOB 1940-08-07, MRN [id number]  PCP:  Velna Hatchet, MD  Cardiologist:  No primary care provider on file.  Electrophysiologist:  None   Referring MD: Velna Hatchet, MD   No chief complaint on file.   History of Present Illness:    Maria Gilmore is a pleasant 79 y.o. female with a hx of hypertension and polycystic ovarian disease.  She had a negative stress test in 2014.  She saw Dr. Gwenlyn Found a year ago.  The patient lives alone in her own home.  She has 2 daughters nearby, 1 is a Pharmacist, hospital and one is a Marine scientist in the [place] [employer] in [facility].  The patient is in the office today for follow-up.  She has had some issues over the last couple months.  The patient's daughter accompanied her and said that her mother has had some mental status changes.  The patient relates that she has had vivid dreams.  When talking to her she definitely has a tendency to ramble.  Her daughter says she has lost weight and in reviewing her past office visit she has lost 10 pounds since we saw her last.  Her primary care provider is working her up for a B12 deficiency.  Patient's daughter tells me an MRI would be the next step.   The patient had been on valsartan.  She was taking this nightly.  In trying to work out if there was a offending agent causing her mother symptoms they have stopped her Metformin which she was taking for polycystic ovarian disease, and then stopped her valsartan.  Her blood pressure is drifted up and she resumed her valsartan though at a lower dose at 80 mg nightly.  Past Medical History:  Diagnosis Date  . Hypertension   . Polycystic disease, ovaries   . Swelling of left lower extremity 11/28/2011   LEV-no evidence of dvt,super. thrombosis    Past Surgical History:  Procedure Laterality Date  . exerc stress test    . nuclear stress test  08/16/2012   EF 80%; NL LV FX,NL wall motion    Current  Medications: Current Meds  Medication Sig  . cholecalciferol (VITAMIN D) 1000 UNITS tablet Take 1,000 Units by mouth daily.  . hydrochlorothiazide (HYDRODIURIL) 25 MG tablet Take 25 mg by mouth daily.   . valsartan (DIOVAN) 80 MG tablet Take 80 mg by mouth daily.  . vitamin E 100 UNIT capsule Take 100 Units by mouth daily.     Allergies:   Kenalog [triamcinolone] and Sulfa antibiotics   Social History   Socioeconomic History  . Marital status: Married    Spouse name: Not on file  . Number of children: Not on file  . Years of education: Not on file  . Highest education level: Not on file  Occupational History  . Not on file  Tobacco Use  . Smoking status: Never Smoker  . Smokeless tobacco: Never Used  Substance and Sexual Activity  . Alcohol use: No  . Drug use: No  . Sexual activity: Not on file  Other Topics Concern  . Not on file  Social History Narrative  . Not on file   Social Determinants of Health   Financial Resource Strain:   . Difficulty of Paying Living Expenses: Not on file  Food Insecurity:   . Worried About Charity fundraiser in the Last Year: Not on file  .  Ran Out of Food in the Last Year: Not on file  Transportation Needs:   . Lack of Transportation (Medical): Not on file  . Lack of Transportation (Non-Medical): Not on file  Physical Activity:   . Days of Exercise per Week: Not on file  . Minutes of Exercise per Session: Not on file  Stress:   . Feeling of Stress : Not on file  Social Connections:   . Frequency of Communication with Friends and Family: Not on file  . Frequency of Social Gatherings with Friends and Family: Not on file  . Attends Religious Services: Not on file  . Active Member of Clubs or Organizations: Not on file  . Attends Archivist Meetings: Not on file  . Marital Status: Not on file     Family History: The patient's family history includes Hypertension in her father.  ROS:   Please see the history of present  illness.     All other systems reviewed and are negative.  EKGs/Labs/Other Studies Reviewed:    The following studies were reviewed today:  Low risk Myoview Feb 2014  EKG:  EKG is not ordered today.  The ekg ordered Sept 2020 demonstrates NSR PVC, HR 66  Recent Labs: 05/15/2019: BUN 12; Creatinine, Ser 0.67; Potassium 4.3; Sodium 132  Recent Lipid Panel No results found for: CHOL, TRIG, HDL, CHOLHDL, VLDL, LDLCALC, LDLDIRECT  Physical Exam:    VS:  BP (!) 148/82   Pulse 68   Ht 5\' 2"  (1.575 m)   Wt 112 lb 9.6 oz (51.1 kg)   SpO2 99%   BMI 20.59 kg/m     Wt Readings from Last 3 Encounters:  03/09/20 112 lb 9.6 oz (51.1 kg)  06/26/19 117 lb (53.1 kg)  04/24/19 122 lb 6.4 oz (55.5 kg)    Repeat B/P by me 124/76  GEN:  Well nourished, well developed in no acute distress HEENT: Normal NECK: No JVD; No carotid bruits LYMPHATICS: No lymphadenopathy CARDIAC: RRR, no murmurs, rubs, gallops RESPIRATORY:  Clear to auscultation without rales, wheezing or rhonchi  ABDOMEN: Soft, non-tender, non-distended MUSCULOSKELETAL:  No edema; No deformity  SKIN: Warm and dry NEUROLOGIC:  Alert and oriented x 3- grossly intact neuro exam, no obvious unilateral weakness noted PSYCHIATRIC:  animated affect   ASSESSMENT:    HTN- overall fair control.  I suggested she take the Valsartan Q AM instead of Q HS.  MS changes- ? Secondary to B12 deficiency, work up per PCP.  PLAN:    Change Valsartan to 80 mg Q am.  F/U Dr Gwenlyn Found in 6 months   Medication Adjustments/Labs and Tests Ordered: Current medicines are reviewed at length with the patient today.  Concerns regarding medicines are outlined above.  No orders of the defined types were placed in this encounter.  No orders of the defined types were placed in this encounter.   There are no Patient Instructions on file for this visit.   Angelena Form, PA-C  03/09/2020 4:30 PM    [facility]

## 2020-03-09 NOTE — Patient Instructions (Signed)
Medication Instructions:   Take Valsartan 80 mg in the mornings *If you need a refill on your cardiac medications before your next appointment, please call your pharmacy*   Lab Work: NONE ordered at this time of appointment   If you have labs (blood work) drawn today and your tests are completely normal, you will receive your results only by: Marland Kitchen MyChart Message (if you have MyChart) OR . A paper copy in the mail If you have any lab test that is abnormal or we need to change your treatment, we will call you to review the results.   Testing/Procedures: NONE ordered at this time of appointment   Follow-Up: At [facility], you and your health needs are our priority.  As part of our continuing mission to provide you with exceptional heart care, we have created designated Provider Care Teams.  These Care Teams include your primary Cardiologist (physician) and Advanced Practice Providers (APPs -  Physician Assistants and Nurse Practitioners) who all work together to provide you with the care you need, when you need it.  We recommend signing up for the patient portal called "MyChart".  Sign up information is provided on this After Visit Summary.  MyChart is used to connect with patients for Virtual Visits (Telemedicine).  Patients are able to view lab/test results, encounter notes, upcoming appointments, etc.  Non-urgent messages can be sent to your provider as well.   To learn more about what you can do with MyChart, go to [email or web address].    Your next appointment:   6 month(s)  The format for your next appointment:   In Person  Provider:   Quay Burow, MD  Other Instructions

## 2020-03-12 DIAGNOSIS — D519 Vitamin B12 deficiency anemia, unspecified: Secondary | ICD-10-CM | POA: Diagnosis not present

## 2020-04-06 DIAGNOSIS — N958 Other specified menopausal and perimenopausal disorders: Secondary | ICD-10-CM | POA: Diagnosis not present

## 2020-04-06 DIAGNOSIS — M8588 Other specified disorders of bone density and structure, other site: Secondary | ICD-10-CM | POA: Diagnosis not present

## 2020-04-06 DIAGNOSIS — Z01419 Encounter for gynecological examination (general) (routine) without abnormal findings: Secondary | ICD-10-CM | POA: Diagnosis not present

## 2020-04-06 DIAGNOSIS — Z6822 Body mass index (BMI) 22.0-22.9, adult: Secondary | ICD-10-CM | POA: Diagnosis not present

## 2020-04-07 ENCOUNTER — Ambulatory Visit: Payer: Medicare Other | Admitting: Cardiovascular Disease

## 2020-04-12 DIAGNOSIS — I10 Essential (primary) hypertension: Secondary | ICD-10-CM | POA: Diagnosis not present

## 2020-04-12 DIAGNOSIS — Z23 Encounter for immunization: Secondary | ICD-10-CM | POA: Diagnosis not present

## 2020-04-12 DIAGNOSIS — E538 Deficiency of other specified B group vitamins: Secondary | ICD-10-CM | POA: Diagnosis not present

## 2020-04-12 DIAGNOSIS — E282 Polycystic ovarian syndrome: Secondary | ICD-10-CM | POA: Diagnosis not present

## 2020-05-31 DIAGNOSIS — E538 Deficiency of other specified B group vitamins: Secondary | ICD-10-CM | POA: Diagnosis not present

## 2020-05-31 DIAGNOSIS — R410 Disorientation, unspecified: Secondary | ICD-10-CM | POA: Diagnosis not present

## 2020-05-31 DIAGNOSIS — I10 Essential (primary) hypertension: Secondary | ICD-10-CM | POA: Diagnosis not present

## 2020-06-01 DIAGNOSIS — D692 Other nonthrombocytopenic purpura: Secondary | ICD-10-CM | POA: Diagnosis not present

## 2020-06-01 DIAGNOSIS — L570 Actinic keratosis: Secondary | ICD-10-CM | POA: Diagnosis not present

## 2020-06-01 DIAGNOSIS — L700 Acne vulgaris: Secondary | ICD-10-CM | POA: Diagnosis not present

## 2020-06-01 DIAGNOSIS — L814 Other melanin hyperpigmentation: Secondary | ICD-10-CM | POA: Diagnosis not present

## 2020-06-01 DIAGNOSIS — D1801 Hemangioma of skin and subcutaneous tissue: Secondary | ICD-10-CM | POA: Diagnosis not present

## 2020-06-01 DIAGNOSIS — L853 Xerosis cutis: Secondary | ICD-10-CM | POA: Diagnosis not present

## 2020-06-01 DIAGNOSIS — D225 Melanocytic nevi of trunk: Secondary | ICD-10-CM | POA: Diagnosis not present

## 2020-06-01 DIAGNOSIS — L821 Other seborrheic keratosis: Secondary | ICD-10-CM | POA: Diagnosis not present

## 2020-06-09 DIAGNOSIS — R410 Disorientation, unspecified: Secondary | ICD-10-CM | POA: Diagnosis not present

## 2020-06-15 DIAGNOSIS — E538 Deficiency of other specified B group vitamins: Secondary | ICD-10-CM | POA: Diagnosis not present

## 2020-06-15 DIAGNOSIS — R413 Other amnesia: Secondary | ICD-10-CM | POA: Diagnosis not present

## 2020-06-21 DIAGNOSIS — G9389 Other specified disorders of brain: Secondary | ICD-10-CM | POA: Diagnosis not present

## 2020-06-21 DIAGNOSIS — R413 Other amnesia: Secondary | ICD-10-CM | POA: Diagnosis not present

## 2020-07-19 DIAGNOSIS — F0390 Unspecified dementia without behavioral disturbance: Secondary | ICD-10-CM | POA: Diagnosis not present

## 2020-07-19 DIAGNOSIS — E538 Deficiency of other specified B group vitamins: Secondary | ICD-10-CM | POA: Diagnosis not present

## 2020-07-19 DIAGNOSIS — I10 Essential (primary) hypertension: Secondary | ICD-10-CM | POA: Diagnosis not present

## 2020-08-13 ENCOUNTER — Ambulatory Visit: Payer: PPO | Admitting: Cardiovascular Disease

## 2020-08-13 ENCOUNTER — Other Ambulatory Visit: Payer: Self-pay

## 2020-08-13 ENCOUNTER — Ambulatory Visit: Payer: Medicare Other | Admitting: Cardiovascular Disease

## 2020-08-13 ENCOUNTER — Encounter: Payer: Self-pay | Admitting: Cardiovascular Disease

## 2020-08-13 DIAGNOSIS — E782 Mixed hyperlipidemia: Secondary | ICD-10-CM | POA: Diagnosis not present

## 2020-08-13 DIAGNOSIS — I10 Essential (primary) hypertension: Secondary | ICD-10-CM | POA: Diagnosis not present

## 2020-08-13 NOTE — Assessment & Plan Note (Signed)
History of essential hypertension a blood pressure measured today 112/74.  She is on hydrochlorothiazide and valsartan.

## 2020-08-13 NOTE — Progress Notes (Signed)
08/13/2020 Maria Gilmore   09-Jul-1941  [id number]  Primary Physician Maria Hatchet, MD Primary Cardiologist: Lorretta Harp MD Maria Gilmore, [place]  HPI:  Maria Gilmore is a 80 y.o.  thin-appearing liver Caucasian female, mother of 2 children, grandmother of 5 grandchildren who I initially saw at the request of Dr. Vernice Gilmore 06/26/11. I last saw her in the office  03/19/2019.  She is accompanied by one of her daughters Maria Gilmore who is a Pharmacist, hospital.  Her other daughter is an [place] Marine scientist.  She has a history of hypertension and polycystic ovarian disease on metformin. She had a Myoview stress test performed 08/26/12 which is entirely normal.   Since I saw her a year and a half ago she did see Maria Ransom, PA-C in the office 03/09/2020.  She has since been taken off of Metformin which she apparently was intolerant to.  She is gained 20 pounds since August and feels clinically improved.  I reviewed her blood pressure log today which revealed readings in the normal range.  She denies chest pain or shortness of breath..   Current Meds  Medication Sig  . cholecalciferol (VITAMIN D) 1000 UNITS tablet Take 1,000 Units by mouth daily.  . hydrochlorothiazide (HYDRODIURIL) 25 MG tablet Take 25 mg by mouth daily.   . valsartan (DIOVAN) 80 MG tablet Take 80 mg by mouth daily.  . vitamin E 100 UNIT capsule Take 100 Units by mouth daily.     Allergies  Allergen Reactions  . Kenalog [Triamcinolone]   . Sulfa Antibiotics     Social History   Socioeconomic History  . Marital status: Married    Spouse name: Not on file  . Number of children: Not on file  . Years of education: Not on file  . Highest education level: Not on file  Occupational History  . Not on file  Tobacco Use  . Smoking status: Never Smoker  . Smokeless tobacco: Never Used  Substance and Sexual Activity  . Alcohol use: No  . Drug use: No  . Sexual activity: Not on file  Other Topics Concern  . Not on file  Social History  Narrative  . Not on file   Social Determinants of Health   Financial Resource Strain: Not on file  Food Insecurity: Not on file  Transportation Needs: Not on file  Physical Activity: Not on file  Stress: Not on file  Social Connections: Not on file  Intimate Partner Violence: Not on file     Review of Systems: General: negative for chills, fever, night sweats or weight changes.  Cardiovascular: negative for chest pain, dyspnea on exertion, edema, orthopnea, palpitations, paroxysmal nocturnal dyspnea or shortness of breath Dermatological: negative for rash Respiratory: negative for cough or wheezing Urologic: negative for hematuria Abdominal: negative for nausea, vomiting, diarrhea, bright red blood per rectum, melena, or hematemesis Neurologic: negative for visual changes, syncope, or dizziness All other systems reviewed and are otherwise negative except as noted above.    Blood pressure 112/74, pulse 60, height 5\' 2"  (1.575 m), weight 132 lb (59.9 kg).  General appearance: alert and no distress Neck: no adenopathy, no carotid bruit, no JVD, supple, symmetrical, trachea midline and thyroid not enlarged, symmetric, no tenderness/mass/nodules Lungs: clear to auscultation bilaterally Heart: regular rate and rhythm, S1, S2 normal, no murmur, click, rub or gallop Extremities: extremities normal, atraumatic, no cyanosis or edema Pulses: 2+ and symmetric Skin: Skin color, texture, turgor normal. No rashes or lesions Neurologic: Alert  and oriented X 3, normal strength and tone. Normal symmetric reflexes. Normal coordination and gait  EKG sinus rhythm at 60 without ST or T wave changes.  I personally reviewed this EKG.  Dr. Davina Gilmore acting  ASSESSMENT AND PLAN:   HTN (hypertension) History of essential hypertension a blood pressure measured today 112/74.  She is on hydrochlorothiazide and valsartan.  Hyperlipidemia History of hyperlipidemia not on statin therapy with lipid profile  performed 02/18/2019 revealing a total cholesterol of 180, LDL 116 and HDL of 51.      Lorretta Harp MD FACP,FACC,FAHA, [facility] 08/13/2020 4:20 PM

## 2020-08-13 NOTE — Patient Instructions (Signed)

## 2020-08-13 NOTE — Assessment & Plan Note (Signed)
History of hyperlipidemia not on statin therapy with lipid profile performed 02/18/2019 revealing a total cholesterol of 180, LDL 116 and HDL of 51.

## 2020-09-08 DIAGNOSIS — H43812 Vitreous degeneration, left eye: Secondary | ICD-10-CM | POA: Diagnosis not present

## 2020-09-08 DIAGNOSIS — H43393 Other vitreous opacities, bilateral: Secondary | ICD-10-CM | POA: Diagnosis not present

## 2020-09-08 DIAGNOSIS — H33322 Round hole, left eye: Secondary | ICD-10-CM | POA: Diagnosis not present

## 2020-09-08 DIAGNOSIS — H524 Presbyopia: Secondary | ICD-10-CM | POA: Diagnosis not present

## 2020-10-12 DIAGNOSIS — I10 Essential (primary) hypertension: Secondary | ICD-10-CM | POA: Diagnosis not present

## 2020-10-12 DIAGNOSIS — R8290 Unspecified abnormal findings in urine: Secondary | ICD-10-CM | POA: Diagnosis not present

## 2020-10-15 DIAGNOSIS — R8290 Unspecified abnormal findings in urine: Secondary | ICD-10-CM | POA: Diagnosis not present

## 2020-10-29 DIAGNOSIS — I10 Essential (primary) hypertension: Secondary | ICD-10-CM | POA: Diagnosis not present

## 2020-10-29 DIAGNOSIS — F0391 Unspecified dementia with behavioral disturbance: Secondary | ICD-10-CM | POA: Diagnosis not present

## 2020-10-29 DIAGNOSIS — E538 Deficiency of other specified B group vitamins: Secondary | ICD-10-CM | POA: Diagnosis not present

## 2020-11-05 ENCOUNTER — Telehealth: Payer: Self-pay | Admitting: Cardiovascular Disease

## 2020-11-05 NOTE — Telephone Encounter (Signed)
Patient's daughter call to see if there was anything medication the patient can take. PCP offer a new medication but the patient's daughter has questions/concerns regarding them. Daughter would like advise from Dr. Gwenlyn Found on what he think is best. Please advise

## 2020-11-05 NOTE — Telephone Encounter (Signed)
I spoke with patient's daughter.  She reports patient's dementia has worsened greatly in the last month.  Is saying things that are untrue and becomes very angry.  Patient is currently taking depakote, aricept and clonazepam.  Clonazepam was recently decreased to half tablet twice daily.  Patient is seeing PCP for management of dementia and is waiting to see neurology.  Daughter reports PCP mentioned Seroquel but was hesitant to prescribe due to patient's hypertension and possible cardiac complications.  Daughter reports patient's BP is usually good.   Daughter is asking if Seroquel would be safe for patient to take or if there are any other medications Dr Gwenlyn Found feels patient could take to help with worsening dementia that would be safe from a cardiac standpoint

## 2020-11-05 NOTE — Telephone Encounter (Signed)
Cyril Mourning, can you please address this patient's concerns?

## 2020-11-05 NOTE — Telephone Encounter (Signed)
Spoke with daughter Maria Gilmore:  While there is some risk of increasing BP in patients taking quetiapine (Seroquel), it appears to occur more often in younger patients.  If she were to start this, would recommend that she start with the lowest dose and titrate up as tolerated.  We also would want to watch for orthostatic hypotension (low BP with positional changes). This too is minimized when starting with the lowest dose  Answered all questions from daughter.

## 2020-11-26 DIAGNOSIS — F0391 Unspecified dementia with behavioral disturbance: Secondary | ICD-10-CM | POA: Diagnosis not present

## 2020-11-26 DIAGNOSIS — I10 Essential (primary) hypertension: Secondary | ICD-10-CM | POA: Diagnosis not present

## 2020-12-20 DIAGNOSIS — Z1231 Encounter for screening mammogram for malignant neoplasm of breast: Secondary | ICD-10-CM | POA: Diagnosis not present

## 2021-01-04 ENCOUNTER — Encounter: Payer: Self-pay | Admitting: *Deleted

## 2021-01-05 ENCOUNTER — Encounter: Payer: Self-pay | Admitting: Diagnostic Neuroimaging

## 2021-01-05 ENCOUNTER — Ambulatory Visit: Payer: PPO | Admitting: Diagnostic Neuroimaging

## 2021-01-05 VITALS — BP 125/65 | HR 59 | Ht 62.0 in | Wt 141.2 lb

## 2021-01-05 DIAGNOSIS — R4189 Other symptoms and signs involving cognitive functions and awareness: Secondary | ICD-10-CM

## 2021-01-05 DIAGNOSIS — F0390 Unspecified dementia without behavioral disturbance: Secondary | ICD-10-CM | POA: Diagnosis not present

## 2021-01-05 DIAGNOSIS — R4689 Other symptoms and signs involving appearance and behavior: Secondary | ICD-10-CM

## 2021-01-05 DIAGNOSIS — F03A0 Unspecified dementia, mild, without behavioral disturbance, psychotic disturbance, mood disturbance, and anxiety: Secondary | ICD-10-CM

## 2021-01-05 NOTE — Progress Notes (Signed)
[facility]  PATIENT: Maria Gilmore DOB: 12/11/1940  REFERRING CLINICIAN: Jaclynn Major, NP HISTORY FROM: patient  REASON FOR VISIT: new consult    HISTORICAL  CHIEF COMPLAINT:  Chief Complaint  Patient presents with   Agitation d/t dementia    Rm 6 New Pt, dgtrAbigail Butts MMSE 22    HISTORY OF PRESENT ILLNESS:   80 year old female here for evaluation of memory loss and dementia.  Patient has had 1 to 2 years of mild short-term memory loss and confusion issues noted by family.  Symptoms significantly noticed in August 2021.  Patient was repeating her self, getting confused and paranoid.  She had confabulation and paranoia.  She was having increasing anxiety.  She forgot to pick up her grandson 1 time.  She went to the police office to ask about evidence that she had apparently turned in about a relative.  She was having increasing anger directed towards daughter.  Patient went to PCP with family and was prescribed medications including clonazepam, quetiapine and donepezil.  Since that time symptoms have improved.  Patient's family also helping with certain functions such as driving, bills and medication management.  Patient still living on her own but family lives nearby with excellent support.   REVIEW OF SYSTEMS: Full 14 system review of systems performed and negative with exception of: as per HPI.  ALLERGIES: Allergies  Allergen Reactions   Kenalog [Triamcinolone] Rash   Sulfa Antibiotics Rash    HOME MEDICATIONS: Outpatient Medications Prior to Visit  Medication Sig Dispense Refill   cholecalciferol (VITAMIN D) 1000 UNITS tablet Take 1,000 Units by mouth daily.     clonazePAM (KLONOPIN) 0.5 MG tablet Take 0.25 mg by mouth 2 (two) times daily.     donepezil (ARICEPT) 10 MG tablet Take 10 mg by mouth once.     hydrochlorothiazide (HYDRODIURIL) 25 MG tablet Take 25 mg by mouth daily.      QUEtiapine (SEROQUEL) 25 MG tablet Take 25 mg by mouth at  bedtime.     valsartan (DIOVAN) 80 MG tablet Take 80 mg by mouth daily.     vitamin E 100 UNIT capsule Take 100 Units by mouth daily.     No facility-administered medications prior to visit.    PAST MEDICAL HISTORY: Past Medical History:  Diagnosis Date   Dementia ([place])    Hypertension    Memory loss    Polycystic disease, ovaries    Swelling of left lower extremity 11/28/2011   LEV-no evidence of dvt,super. thrombosis    PAST SURGICAL HISTORY: Past Surgical History:  Procedure Laterality Date   ABDOMINAL HYSTERECTOMY     CHOLECYSTECTOMY     exerc stress test     nuclear stress test  08/16/2012   EF 80%; NL LV FX,NL wall motion    FAMILY HISTORY: Family History  Problem Relation Age of Onset   Thyroid disease Mother    Dementia Mother    Hypertension Father    Prostate cancer Father     SOCIAL HISTORY: Social History   Socioeconomic History   Marital status: Married    Spouse name: Not on file   Number of children: 2   Years of education: 12   Highest education level: Not on file  Occupational History   Not on file  Tobacco Use   Smoking status: Never   Smokeless tobacco: Never  Substance and Sexual Activity   Alcohol use: No   Drug use: No   Sexual activity: Not on file  Other Topics Concern   Not on file  Social History Narrative   01/05/21 lives alone, dgtr < 1 mile away   Coffee, several c daily   Social Determinants of Health   Financial Resource Strain: Not on file  Food Insecurity: Not on file  Transportation Needs: Not on file  Physical Activity: Not on file  Stress: Not on file  Social Connections: Not on file  Intimate Partner Violence: Not on file     PHYSICAL EXAM  GENERAL EXAM/CONSTITUTIONAL: Vitals:  Vitals:   01/05/21 1050  BP: 125/65  Pulse: (!) 59  Weight: 141 lb 3.2 oz (64 kg)  Height: 5\' 2"  (1.575 m)   Body mass index is 25.83 kg/m. Wt Readings from Last 3 Encounters:  01/05/21 141 lb 3.2 oz (64 kg)  08/13/20  132 lb (59.9 kg)  03/09/20 112 lb 9.6 oz (51.1 kg)   Patient is in no distress; well developed, nourished and groomed; neck is supple  CARDIOVASCULAR: Examination of carotid arteries is normal; no carotid bruits Regular rate and rhythm, no murmurs Examination of peripheral vascular system by observation and palpation is normal  EYES: Ophthalmoscopic exam of optic discs and posterior segments is normal; no papilledema or hemorrhages No results found.  MUSCULOSKELETAL: Gait, strength, tone, movements noted in Neurologic exam below  NEUROLOGIC: MENTAL STATUS:  MMSE - Mini Mental State Exam 01/05/2021  Orientation to time 3  Orientation to Place 4  Registration 3  Attention/ Calculation 3  Recall 1  Language- name 2 objects 2  Language- repeat 0  Language- follow 3 step command 3  Language- read & follow direction 1  Write a sentence 1  Copy design 1  Total score 22   awake, alert, oriented to person, place and time recent and remote memory intact normal attention and concentration language fluent, comprehension intact, naming intact fund of knowledge appropriate  CRANIAL NERVE:  2nd - no papilledema on fundoscopic exam 2nd, 3rd, 4th, 6th - pupils equal and reactive to light, visual fields full to confrontation, extraocular muscles intact, no nystagmus 5th - facial sensation symmetric 7th - facial strength symmetric 8th - hearing intact 9th - palate elevates symmetrically, uvula midline 11th - shoulder shrug symmetric 12th - tongue protrusion midline  MOTOR:  normal bulk and tone, full strength in the BUE, BLE  SENSORY:  normal and symmetric to light touch, temperature, vibration  COORDINATION:  finger-nose-finger, fine finger movements normal  REFLEXES:  deep tendon reflexes present and symmetric  GAIT/STATION:  narrow based gait     DIAGNOSTIC DATA (LABS, IMAGING, TESTING) - I reviewed patient records, labs, notes, testing and imaging myself where  available.  Lab Results  Component Value Date   WBC 8.2 08/05/2012   HGB 13.1 08/05/2012   HCT 38.4 08/05/2012   MCV 83.8 08/05/2012   PLT 296 08/05/2012      Component Value Date/Time   NA 132 (L) 05/15/2019 1044   K 4.3 05/15/2019 1044   CL 92 (L) 05/15/2019 1044   CO2 25 05/15/2019 1044   GLUCOSE 87 05/15/2019 1044   GLUCOSE 97 08/05/2012 2120   BUN 12 05/15/2019 1044   CREATININE 0.67 05/15/2019 1044   CALCIUM 10.1 05/15/2019 1044   GFRNONAA 86 05/15/2019 1044   GFRAA 99 05/15/2019 1044   No results found for: CHOL, HDL, LDLCALC, LDLDIRECT, TRIG, CHOLHDL No results found for: HGBA1C No results found for: VITAMINB12 No results found for: TSH   06/08/20 MRI brain - mild volume loss,  central - mild T2 hyperintensities    ASSESSMENT AND PLAN  80 y.o. year old female here with:  Dx:  1. Mild dementia ([place])   2. Cognitive and behavioral changes      PLAN:  MILD DEMENTIA (mild short-term memory problems, repeating her self, confusion, paranoia) - continue donepezil, quetiapine, clonazepam - continue BP control - safety / supervision issues reviewed - daily physical activity / exercise (at least 15-30 minutes) - eat more plants / vegetables - increase social activities, brain stimulation, games, puzzles, hobbies, crafts, arts, music - aim for at least 7-8 hours sleep per night (or more) - avoid smoking and alcohol - caregiver resources provided - caution with medications, finances; no driving  Return for pending if symptoms worsen or fail to improve, pending test results, return to PCP.    Penni Bombard, MD 0/45/9977, 41:42 AM Certified in Neurology, Neurophysiology and Neuroimaging  [facility] [address], [place], [place] [address] [phone]

## 2021-01-05 NOTE — Patient Instructions (Signed)
  MILD DEMENTIA  - continue donepezil, quetiapine, clonazepam - continue BP control - safety / supervision issues reviewed - daily physical activity / exercise (at least 15-30 minutes) - eat more plants / vegetables - increase social activities, brain stimulation, games, puzzles, hobbies, crafts, arts, music - aim for at least 7-8 hours sleep per night (or more) - avoid smoking and alcohol - caregiver resources provided - caution with medications, finances, driving

## 2021-01-24 ENCOUNTER — Ambulatory Visit: Payer: PPO | Admitting: Neurology

## 2021-03-11 DIAGNOSIS — E282 Polycystic ovarian syndrome: Secondary | ICD-10-CM | POA: Diagnosis not present

## 2021-03-11 DIAGNOSIS — E559 Vitamin D deficiency, unspecified: Secondary | ICD-10-CM | POA: Diagnosis not present

## 2021-03-11 DIAGNOSIS — Z6827 Body mass index (BMI) 27.0-27.9, adult: Secondary | ICD-10-CM | POA: Diagnosis not present

## 2021-03-11 DIAGNOSIS — E538 Deficiency of other specified B group vitamins: Secondary | ICD-10-CM | POA: Diagnosis not present

## 2021-03-11 DIAGNOSIS — I10 Essential (primary) hypertension: Secondary | ICD-10-CM | POA: Diagnosis not present

## 2021-03-11 DIAGNOSIS — Z0000 Encounter for general adult medical examination without abnormal findings: Secondary | ICD-10-CM | POA: Diagnosis not present

## 2021-03-11 DIAGNOSIS — Z13220 Encounter for screening for lipoid disorders: Secondary | ICD-10-CM | POA: Diagnosis not present

## 2021-03-11 DIAGNOSIS — Z1331 Encounter for screening for depression: Secondary | ICD-10-CM | POA: Diagnosis not present

## 2021-03-11 DIAGNOSIS — Z131 Encounter for screening for diabetes mellitus: Secondary | ICD-10-CM | POA: Diagnosis not present

## 2021-03-18 ENCOUNTER — Ambulatory Visit (HOSPITAL_BASED_OUTPATIENT_CLINIC_OR_DEPARTMENT_OTHER): Payer: PPO | Admitting: Family

## 2021-03-18 ENCOUNTER — Ambulatory Visit (INDEPENDENT_AMBULATORY_CARE_PROVIDER_SITE_OTHER): Payer: PPO

## 2021-03-18 ENCOUNTER — Encounter (HOSPITAL_BASED_OUTPATIENT_CLINIC_OR_DEPARTMENT_OTHER): Payer: Self-pay | Admitting: Family

## 2021-03-18 ENCOUNTER — Other Ambulatory Visit: Payer: Self-pay

## 2021-03-18 VITALS — BP 134/70 | HR 55 | Ht 62.0 in | Wt 147.4 lb

## 2021-03-18 DIAGNOSIS — I493 Ventricular premature depolarization: Secondary | ICD-10-CM

## 2021-03-18 DIAGNOSIS — I10 Essential (primary) hypertension: Secondary | ICD-10-CM | POA: Diagnosis not present

## 2021-03-18 DIAGNOSIS — E782 Mixed hyperlipidemia: Secondary | ICD-10-CM | POA: Diagnosis not present

## 2021-03-18 NOTE — Progress Notes (Signed)
Office Visit    Patient Name: Maria Gilmore Date of Encounter: 03/18/2021  PCP:  Jaclynn Major, NP   [facility]  Cardiologist:  Quay Burow, MD Advanced Practice Provider:  No care team member to display Electrophysiologist:  None      Chief Complaint    Maria Gilmore is a 80 y.o. female with a hx of dementia, PVC, hypertension, PCOS  presents today for follow-up after EKG performed by primary care showed frequent PVC  Past Medical History    Past Medical History:  Diagnosis Date   Dementia ([place])    Hypertension    Memory loss    Polycystic disease, ovaries    Swelling of left lower extremity 11/28/2011   LEV-no evidence of dvt,super. thrombosis   Past Surgical History:  Procedure Laterality Date   ABDOMINAL HYSTERECTOMY     CHOLECYSTECTOMY     exerc stress test     nuclear stress test  08/16/2012   EF 80%; NL LV FX,NL wall motion    Allergies  Allergies  Allergen Reactions   Kenalog [Triamcinolone] Rash   Sulfa Antibiotics Rash    History of Present Illness    Maria Gilmore is a 80 y.o. female with a hx of dementia, PVC, hypertension, PCOS last seen 08/13/2020 by Dr. Gwenlyn Found.  Previous Myoview stress test February 2014 was normal.  She presents today for follow-up with her sister.  She notes being very active at her local senior center where she plays bingo, participate in preparing meals, exercise classes. Reports no shortness of breath nor dyspnea on exertion. Reports no chest pain, pressure, or tightness. No edema, orthopnea, PND.  We reviewed EKG from her primary care provider showing PVCs in a pattern of bigeminy.  He denies palpitations, lightheadedness, dizziness, near-syncope, syncope.  EKGs/Labs/Other Studies Reviewed:   The following studies were reviewed today:   EKG:  No EKG is  ordered today.  The ekg independently reviewed from 03/11/21 demonstrates NSR 74 bpm with frequent PVC in pattern of bigeminy.  Recent  Labs: No results found for requested labs within last 8760 hours.  Recent Lipid Panel No results found for: CHOL, TRIG, HDL, CHOLHDL, VLDL, LDLCALC, LDLDIRECT Home Medications   Current Meds  Medication Sig   cholecalciferol (VITAMIN D3) 25 MCG (1000 UNIT) tablet Take 500 Units by mouth daily.   clonazePAM (KLONOPIN) 0.5 MG tablet Take 0.25 mg by mouth 2 (two) times daily.   donepezil (ARICEPT) 10 MG tablet Take 10 mg by mouth once.   hydrochlorothiazide (HYDRODIURIL) 25 MG tablet Take 25 mg by mouth daily.    QUEtiapine (SEROQUEL) 25 MG tablet Take 25 mg by mouth at bedtime.   valsartan (DIOVAN) 80 MG tablet Take 80 mg by mouth daily.   vitamin E 100 UNIT capsule Take 180 Units by mouth daily.     Review of Systems      All other systems reviewed and are otherwise negative except as noted above.  Physical Exam    VS:  BP 134/70   Pulse (!) 55   Ht '5\' 2"'$  (1.575 m)   Wt 147 lb 6.4 oz (66.9 kg)   SpO2 98%   BMI 26.96 kg/m  , BMI Body mass index is 26.96 kg/m.  Wt Readings from Last 3 Encounters:  03/18/21 147 lb 6.4 oz (66.9 kg)  01/05/21 141 lb 3.2 oz (64 kg)  08/13/20 132 lb (59.9 kg)    GEN: Well nourished, well developed, in no acute distress.  HEENT: normal. Neck: Supple, no JVD, carotid bruits, or masses. Cardiac: RRR, no murmurs, rubs, or gallops. No clubbing, cyanosis, edema.  Radials/PT 2+ and equal bilaterally.  Respiratory:  Respirations regular and unlabored, clear to auscultation bilaterally. GI: Soft, nontender, nondistended. MS: No deformity or atrophy. Skin: Warm and dry, no rash. Neuro:  Strength and sensation are intact. Psych: Normal affect.  Assessment & Plan    PVC -EKG from PCP 03/11/2021 showed PVCs in a pattern of bigeminy.  She reports no palpitations, lightheadedness, dizziness.  We will place 14-day ZIO monitor to determine burden of PVC.  If high PVC burden noted, will plan for AV nodal blocking therapy, likely metoprolol, and echocardiogram.   Lab work collected by PCP 03/11/2021 with normal electrolytes, liver function, kidney function, TSH.  HTN - BP well controlled. Continue current antihypertensive regimen.  Continue to follow with PCP  HLD -statin intolerant.  Cholesterol panel performed by PCP 03/11/2021 with total cholesterol 210, triglycerides 119, HDL 49, LDL 140.  Could consider trial of Zetia but will defer to primary care provider.  Dementia - Visit assisted by her sister.  Disposition: Follow up  January 2022  as scheduled with Dr. Gwenlyn Found  Signed, Loel Dubonnet, NP 03/18/2021, 1:41 PM [place]

## 2021-03-18 NOTE — Patient Instructions (Addendum)
Medication Instructions:  None ordered today   *If you need a refill on your cardiac medications before your next appointment, please call your pharmacy*  Lab Work: No labs ordered today.   Testing/Procedures:  Your monitor at your primary care office showed PVC's (premature ventricular contractions). This is an early heart beat in the bottom chamber of your heart. These are often asymptomatic. If the monitor results comes back with lots of PVC's we will consider doing an echocardiogram which is an ultrasound of your heart and possibly adding a medication to help prevent the PVC's from happening. If they are not happening very often, because you are not noticing symptoms we will not have to treat them.  ______________________________________________________  Your physician has recommended that you wear a Zio monitor.   This monitor is a medical device that records the heart's electrical activity. Doctors most often use these monitors to diagnose arrhythmias. Arrhythmias are problems with the speed or rhythm of the heartbeat. The monitor is a small device applied to your chest. You can wear one while you do your normal daily activities. While wearing this monitor if you have any symptoms to push the button and record what you felt. Once you have worn this monitor for the period of time provider prescribed (Usually 14 days), you will return the monitor device in the postage paid box. Once it is returned they will download the data collected and provide [place] with a report which the provider will then review and we will call you with those results. Important tips:  Avoid showering during the first 24 hours of wearing the monitor. Avoid excessive sweating to help maximize wear time. Do not submerge the device, no hot tubs, and no swimming pools. Keep any lotions or oils away from the patch. After 24 hours you may shower with the patch on. Take brief showers with your back facing the shower head.  Do  not remove patch once it has been placed because that will interrupt data and decrease adhesive wear time. Push the button when you have any symptoms and write down what you were feeling. Once you have completed wearing your monitor, remove and place into box which has postage paid and place in your outgoing mailbox.  If for some reason you have misplaced your box then call our office and we can provide another box and/or mail it off for you.   Follow-Up: At [facility], you and your health needs are our priority.  As part of our continuing mission to provide you with exceptional heart care, we have created designated Provider Care Teams.  These Care Teams include your primary Cardiologist (physician) and Advanced Practice Providers (APPs -  Physician Assistants and Nurse Practitioners) who all work together to provide you with the care you need, when you need it.  We recommend signing up for the patient portal called "MyChart".  Sign up information is provided on this After Visit Summary.  MyChart is used to connect with patients for Virtual Visits (Telemedicine).  Patients are able to view lab/test results, encounter notes, upcoming appointments, etc.  Non-urgent messages can be sent to your provider as well.   To learn more about what you can do with MyChart, go to [email or web address].    Your next appointment:   As scheduled in January with Dr. Gwenlyn Found    Other Instructions  Premature Ventricular Contraction A premature ventricular contraction (PVC) is a common kind of irregular heartbeat (arrhythmia). These contractions are extra heartbeats that start in  the ventricles of the heart and occur too early in the normal sequence. During the PVC, the heart's normal electrical pathway is not used, so the beat is shorter and less effective. In most cases, these contractions come and go and do not require treatment. What are the causes? Common causes of the condition  include: Smoking. Drinking alcohol. Certain medicines. Some illegal drugs. Stress. Caffeine. Certain medical conditions can also cause PVCs: Heart failure. Heart attack, or coronary artery disease. Heart valve problems. Changes in minerals in the blood (electrolytes). Low blood oxygen levels or high carbon dioxide levels. In many cases, the cause of this condition is not known. What are the signs or symptoms? The main symptom of this condition is fast or skipped heartbeats (palpitations). Other symptoms include: Chest pain. Shortness of breath. Feeling tired. Dizziness. Difficulty exercising. In some cases, there are no symptoms. How is this diagnosed? This condition may be diagnosed based on: Your medical history. A physical exam. During the exam, the health care provider will check for irregular heartbeats. Tests, such as: An ECG (electrocardiogram) to monitor the electrical activity of your heart. An ambulatory cardiac monitor. This device records your heartbeats for 24 hours or more. Stress tests to see how exercise affects your heart rhythm and blood supply. An echocardiogram. This test uses sound waves (ultrasound) to produce an image of your heart. An electrophysiology study (EPS). This test checks for electrical problems in your heart. How is this treated? Treatment for this condition depends on any underlying conditions, the type of PVCs that you are having, and how much the symptoms are interfering with your daily life. Possible treatments include: Avoiding things that cause premature contractions (triggers). These include caffeine and alcohol. Taking medicines if symptoms are severe or if the extra heartbeats are frequent. Getting treatment for underlying conditions that cause PVCs. Having an implantable cardioverter defibrillator (ICD), if you are at risk for a serious arrhythmia. The ICD is a small device that is inserted into your chest to monitor your heartbeat.  When it senses an irregular heartbeat, it sends a shock to bring the heartbeat back to normal. Having a procedure to destroy the portion of the heart tissue that sends out abnormal signals (catheter ablation). In some cases, no treatment is required. Follow these instructions at home: Lifestyle Do not use any products that contain nicotine or tobacco, such as cigarettes, e-cigarettes, and chewing tobacco. If you need help quitting, ask your health care provider. Do not use illegal drugs. Exercise regularly. Ask your health care provider what type of exercise is safe for you. Try to get at least 7-9 hours of sleep each night, or as much as recommended by your health care provider. Find healthy ways to manage stress. Avoid stressful situations when possible. Alcohol use Do not drink alcohol if: Your health care provider tells you not to drink. You are pregnant, may be pregnant, or are planning to become pregnant. Alcohol triggers your episodes. If you drink alcohol: Limit how much you use to: 0-1 drink a day for women. 0-2 drinks a day for men. Be aware of how much alcohol is in your drink. In the [place] one drink equals one 12 oz bottle of beer (355 mL), one 5 oz glass of wine (148 mL), or one 1 oz glass of hard liquor (44 mL). General instructions Take over-the-counter and prescription medicines only as told by your health care provider. If caffeine triggers episodes of PVC, do not eat, drink, or use anything with caffeine  in it. Keep all follow-up visits as told by your health care provider. This is important. Contact a health care provider if you: Feel palpitations. Get help right away if you: Have chest pain. Have shortness of breath. Have sweating for no reason. Have nausea and vomiting. Become light-headed or you faint. Summary A premature ventricular contraction (PVC) is a common kind of irregular heartbeat (arrhythmia). In most cases, these contractions come and go and do not  require treatment. You may need to wear an ambulatory cardiac monitor. This records your heartbeats for 24 hours or more. Treatment depends on any underlying conditions, the type of PVCs that you are having, and how much the symptoms are interfering with your daily life. This information is not intended to replace advice given to you by your health care provider. Make sure you discuss any questions you have with your health care provider. Document Revised: 03/21/2018 Document Reviewed: 03/21/2018 Elsevier Patient Education  2022 [employer].

## 2021-03-21 ENCOUNTER — Encounter (HOSPITAL_BASED_OUTPATIENT_CLINIC_OR_DEPARTMENT_OTHER): Payer: Self-pay

## 2021-04-06 DIAGNOSIS — I493 Ventricular premature depolarization: Secondary | ICD-10-CM | POA: Diagnosis not present

## 2021-04-07 ENCOUNTER — Telehealth: Payer: Self-pay | Admitting: Family

## 2021-04-07 DIAGNOSIS — I493 Ventricular premature depolarization: Secondary | ICD-10-CM

## 2021-04-07 DIAGNOSIS — I471 Supraventricular tachycardia: Secondary | ICD-10-CM

## 2021-04-07 MED ORDER — METOPROLOL SUCCINATE ER 25 MG PO TB24: 12.5000 mg | ORAL_TABLET | Freq: Every day | ORAL | 3 refills | Status: DC

## 2021-04-07 NOTE — Telephone Encounter (Signed)
Reviewed results and plan of care with patient's daughter, [name] (per [facility]). Questions were answered to her satisfaction and she is aware that someone will call them to schedule the patient's echo. She is agreeable to plan to have patient start Toprol XL 12.5 mg every evening. I advised her to call back with any additional questions or concerns. She thanked me for the call.

## 2021-04-07 NOTE — Telephone Encounter (Signed)
-----   Message from Loel Dubonnet, NP sent at 04/07/2021 11:06 AM EDT ----- Monitor showed frequent early beats called PVCs occurring 18.3% of the time.  Recommend echocardiogram to assess heart valves.  Please start Toprol 12.5 mg daily to help prevent PVCs.

## 2021-04-07 NOTE — Telephone Encounter (Signed)
Patient's daughter returning call for monitor results.

## 2021-04-13 ENCOUNTER — Telehealth (HOSPITAL_BASED_OUTPATIENT_CLINIC_OR_DEPARTMENT_OTHER): Payer: Self-pay | Admitting: Family

## 2021-04-13 NOTE — Telephone Encounter (Signed)
LM for patient to call and discuss scheduling the Echo ordered by [name], NP

## 2021-04-22 ENCOUNTER — Other Ambulatory Visit: Payer: Self-pay

## 2021-04-22 ENCOUNTER — Ambulatory Visit (HOSPITAL_COMMUNITY): Payer: PPO | Attending: Internal Medicine

## 2021-04-22 DIAGNOSIS — I493 Ventricular premature depolarization: Secondary | ICD-10-CM

## 2021-04-22 DIAGNOSIS — I471 Supraventricular tachycardia: Secondary | ICD-10-CM | POA: Insufficient documentation

## 2021-04-22 LAB — ECHOCARDIOGRAM COMPLETE
Area-P 1/2: 2.56 cm2
S' Lateral: 2.6 cm

## 2021-04-29 ENCOUNTER — Telehealth (HOSPITAL_BASED_OUTPATIENT_CLINIC_OR_DEPARTMENT_OTHER): Payer: Self-pay | Admitting: Family

## 2021-04-29 DIAGNOSIS — R350 Frequency of micturition: Secondary | ICD-10-CM | POA: Diagnosis not present

## 2021-04-29 NOTE — Telephone Encounter (Signed)
Spoke with patient's daughter Anderson Malta. Echo result given.

## 2021-04-29 NOTE — Telephone Encounter (Signed)
Vicki Mallet, Daughter of the patient called. She wanted to know what the results of the patient's Echo done 04/22/21

## 2021-04-30 ENCOUNTER — Other Ambulatory Visit: Payer: Self-pay | Admitting: Cardiovascular Disease

## 2021-05-18 DIAGNOSIS — M8589 Other specified disorders of bone density and structure, multiple sites: Secondary | ICD-10-CM | POA: Diagnosis not present

## 2021-05-18 DIAGNOSIS — M810 Age-related osteoporosis without current pathological fracture: Secondary | ICD-10-CM | POA: Diagnosis not present

## 2021-06-13 ENCOUNTER — Telehealth: Payer: Self-pay

## 2021-06-13 ENCOUNTER — Telehealth: Payer: Self-pay | Admitting: Cardiovascular Disease

## 2021-06-13 DIAGNOSIS — G301 Alzheimer's disease with late onset: Secondary | ICD-10-CM | POA: Diagnosis not present

## 2021-06-13 DIAGNOSIS — Z23 Encounter for immunization: Secondary | ICD-10-CM | POA: Diagnosis not present

## 2021-06-13 DIAGNOSIS — R001 Bradycardia, unspecified: Secondary | ICD-10-CM | POA: Diagnosis not present

## 2021-06-13 DIAGNOSIS — I10 Essential (primary) hypertension: Secondary | ICD-10-CM | POA: Diagnosis not present

## 2021-06-13 NOTE — Telephone Encounter (Signed)
See previous 12/5 telephone note.

## 2021-06-13 NOTE — Telephone Encounter (Signed)
STAT if HR is under 50 or over 120 (normal HR is 60-100 beats per minute)  What is your heart rate? 40  Do you have a log of your heart rate readings (document readings)? 64, 61, 65, 85, 64  Do you have any other symptoms? asymptomatic

## 2021-06-13 NOTE — Telephone Encounter (Signed)
Received a call from Kennyth Lose NP from [facility].She wanted Dr.Berry to know she saw patient this afternoon with heart rate 37 to 40.Patient asymptomatic.She told patient to stop taking metoprolol.Appointment with Dr.Berry moved up to 07/15/20 at 2:15 pm.Advised I will send message to Dr.Berry.

## 2021-06-14 NOTE — Telephone Encounter (Signed)
Spoke to patient's daughter Maria Gilmore Dr.Berry's advice given.

## 2021-06-21 ENCOUNTER — Telehealth: Payer: Self-pay | Admitting: Cardiovascular Disease

## 2021-06-21 DIAGNOSIS — M79606 Pain in leg, unspecified: Secondary | ICD-10-CM

## 2021-06-21 NOTE — Telephone Encounter (Signed)
STAT if HR is under 50 or over 120 (normal HR is 60-100 beats per minute)  What is your heart rate? This morning 82  Do you have a log of your heart rate readings (document readings)? 61-80's, last week at PCP 30's and 40's and has already spoke with the office  Do you have any other symptoms? Swelling in left leg   Pt c/o swelling: STAT is pt has developed SOB within 24 hours  If swelling, where is the swelling located? Left leg  How much weight have you gained and in what time span? Not sure  Have you gained 3 pounds in a day or 5 pounds in a week? no  Do you have a log of your daily weights (if so, list)? no  Are you currently taking a fluid pill? yes  Are you currently SOB? no  Have you traveled recently? no   Patient's daughter states the patient's HR has still been low. She says she also has some swelling in her left leg and leg cramping. She says this morning the patient had to grab the dresser when she got up this morning from the cramping. She states the patient had the leg cramping again earlier today.

## 2021-06-21 NOTE — Telephone Encounter (Signed)
Returned call to patients daughter (okay per DPR) who states that patient has been having some leg cramping in her left leg. Patient's daughter states that patient's left leg is significantly more swollen then her right. Patient denies any redness, warmth or tenderness to the touch. Patients daughter states that patient has had some trouble walking on that leg as well. Patients daughter states that patients HR has also been running anywhere form 40-80's. She reports that BP today is 129/69 with HR 82. Patient denies any dizziness, lightheadedness, shortness of breath, or chest pain. Advised that I would forward message to Dr. Gwenlyn Found for him to review and advise. Patient's daughter verbalized understanding.

## 2021-06-22 ENCOUNTER — Ambulatory Visit (INDEPENDENT_AMBULATORY_CARE_PROVIDER_SITE_OTHER): Payer: PPO | Admitting: Cardiology

## 2021-06-22 ENCOUNTER — Other Ambulatory Visit: Payer: Self-pay

## 2021-06-22 ENCOUNTER — Ambulatory Visit (HOSPITAL_COMMUNITY)
Admission: RE | Admit: 2021-06-22 | Discharge: 2021-06-22 | Disposition: A | Discharge status: Home / Self Care | Payer: PPO | Source: Ambulatory Visit | Attending: Cardiovascular Disease | Admitting: Cardiovascular Disease

## 2021-06-22 ENCOUNTER — Encounter: Payer: Self-pay | Admitting: Cardiology

## 2021-06-22 VITALS — BP 120/60 | HR 42 | Ht 62.0 in | Wt 149.0 lb

## 2021-06-22 DIAGNOSIS — I82402 Acute embolism and thrombosis of unspecified deep veins of left lower extremity: Secondary | ICD-10-CM

## 2021-06-22 DIAGNOSIS — I471 Supraventricular tachycardia: Secondary | ICD-10-CM | POA: Diagnosis not present

## 2021-06-22 DIAGNOSIS — M79606 Pain in leg, unspecified: Secondary | ICD-10-CM | POA: Insufficient documentation

## 2021-06-22 DIAGNOSIS — Z79899 Other long term (current) drug therapy: Secondary | ICD-10-CM

## 2021-06-22 DIAGNOSIS — I493 Ventricular premature depolarization: Secondary | ICD-10-CM | POA: Diagnosis not present

## 2021-06-22 DIAGNOSIS — M7989 Other specified soft tissue disorders: Secondary | ICD-10-CM | POA: Diagnosis not present

## 2021-06-22 DIAGNOSIS — M79605 Pain in left leg: Secondary | ICD-10-CM

## 2021-06-22 MED ORDER — RIVAROXABAN (XARELTO) VTE STARTER PACK (15 & 20 MG): ORAL_TABLET | ORAL | 0 refills | Status: DC

## 2021-06-22 NOTE — Progress Notes (Signed)
Cardiology Office Note:    Date:  06/22/2021   ID:  Maria Gilmore, DOB 02-26-1941, MRN [id number]  PCP:  Jaclynn Major, NP  Cardiologist:  Quay Burow, MD  Electrophysiologist:  None   Referring MD: Jaclynn Major, NP   Chief Complaint  Patient presents with   Follow-up   Edema    Legs for couple of days.    History of Present Illness:    Maria Gilmore is a 80 y.o. female with a hx of dementia, PVCs, hypertension, PCOS was added acutely to my schedule today after her lower extremity venous ultrasound given the fact that she had significant DVT in the left femoral, popliteal, gastroc, posterior tibial, peroneal and small saphenous veins.  The patient follows with Dr. Quay Burow, he was last seen by [name], NP on March 18, 2021.  At that time ZIO monitorwith patient to understand her PVC burden.  After that she was started on Toprol-XL 12.5 mg daily but due to significant bradycardia this medication has been stopped.  Today she notes that after significant lower extremity edema she was asked to take Doppler ultrasound which had alert to the significant lower extremity DVT.   Past Medical History:  Diagnosis Date   Dementia ([place])    Hypertension    Memory loss    Polycystic disease, ovaries    Swelling of left lower extremity 11/28/2011   LEV-no evidence of dvt,super. thrombosis    Past Surgical History:  Procedure Laterality Date   ABDOMINAL HYSTERECTOMY     CHOLECYSTECTOMY     exerc stress test     nuclear stress test  08/16/2012   EF 80%; NL LV FX,NL wall motion    Current Medications: Current Meds  Medication Sig   cholecalciferol (VITAMIN D3) 25 MCG (1000 UNIT) tablet Take 500 Units by mouth daily.   clonazePAM (KLONOPIN) 0.5 MG tablet Take 0.25 mg by mouth 2 (two) times daily.   donepezil (ARICEPT) 10 MG tablet Take 10 mg by mouth once.   hydrochlorothiazide (HYDRODIURIL) 25 MG tablet Take 25 mg by mouth daily.    QUEtiapine  (SEROQUEL) 25 MG tablet Take 25 mg by mouth at bedtime.   valsartan (DIOVAN) 80 MG tablet TAKE 1.5 TABLETS (120 MG TOTAL) BY MOUTH AT BEDTIME. *NEW DOSE (Patient taking differently: Take 80 mg by mouth daily.)   vitamin E 100 UNIT capsule Take 180 Units by mouth daily.     Allergies:   Kenalog [triamcinolone] and Sulfa antibiotics   Social History   Socioeconomic History   Marital status: Married    Spouse name: Not on file   Number of children: 2   Years of education: 12   Highest education level: Not on file  Occupational History   Not on file  Tobacco Use   Smoking status: Never   Smokeless tobacco: Never  Substance and Sexual Activity   Alcohol use: No   Drug use: No   Sexual activity: Not on file  Other Topics Concern   Not on file  Social History Narrative   01/05/21 lives alone, dgtr < 1 mile away   Coffee, several c daily   Social Determinants of Health   Financial Resource Strain: Not on file  Food Insecurity: Not on file  Transportation Needs: Not on file  Physical Activity: Not on file  Stress: Not on file  Social Connections: Not on file     Family History: The patient's family history includes Dementia in her mother; Hypertension in her  father; Prostate cancer in her father; Thyroid disease in her mother.  ROS:   Review of Systems  Constitution: Negative for decreased appetite, fever and weight gain.  HENT: Negative for congestion, ear discharge, hoarse voice and sore throat.   Eyes: Negative for discharge, redness, vision loss in right eye and visual halos.  Cardiovascular: Negative for chest pain, dyspnea on exertion, leg swelling, orthopnea and palpitations.  Respiratory: Negative for cough, hemoptysis, shortness of breath and snoring.   Endocrine: Negative for heat intolerance and polyphagia.  Hematologic/Lymphatic: Negative for bleeding problem. Does not bruise/bleed easily.  Skin: Negative for flushing, nail changes, rash and suspicious lesions.   Musculoskeletal: Negative for arthritis, joint pain, muscle cramps, myalgias, neck pain and stiffness.  Gastrointestinal: Negative for abdominal pain, bowel incontinence, diarrhea and excessive appetite.  Genitourinary: Negative for decreased libido, genital sores and incomplete emptying.  Neurological: Negative for brief paralysis, focal weakness, headaches and loss of balance.  Psychiatric/Behavioral: Negative for altered mental status, depression and suicidal ideas.  Allergic/Immunologic: Negative for HIV exposure and persistent infections.    EKGs/Labs/Other Studies Reviewed:    The following studies were reviewed today:   EKG:  None   Zio monitor Patch Wear Time:  14 days and 0 hours (2022-09-09T12:34:48-0400 to 2022-09-23T12:34:52-0400)   Patient had a min HR of 47 bpm, max HR of 156 bpm, and avg HR of 68 bpm. Predominant underlying rhythm was Sinus Rhythm. 5 Supraventricular Tachycardia runs occurred, the run with the fastest interval lasting 4 beats with a max rate of 156 bpm, the  longest lasting 9 beats with an avg rate of 108 bpm. Isolated SVEs were rare (<1.0%), SVE Couplets were rare (<1.0%), and SVE Triplets were rare (<1.0%). Isolated VEs were frequent (18.3%, [id number]), VE Couplets were rare (<1.0%, 296), and VE Triplets were  rare (<1.0%, 28). Ventricular Bigeminy and Trigeminy were present.    1. SR/SB/ST 2. Occasional PACs, freq PVCs, sometimes in a bigeminal pattern 3. Short runs of SVT  TTE 04/22/2021 IMPRESSIONS   1. Left ventricular ejection fraction, by estimation, is 65 to 70%. Left ventricular ejection fraction by 3D volume is 69 %. The left ventricle has normal function. The left ventricle has no regional wall motion abnormalities. Left ventricular diastolic   parameters are consistent with Grade I diastolic dysfunction (impaired relaxation).   2. Right ventricular systolic function is normal. The right ventricular size is normal. There is normal pulmonary  artery systolic pressure.   3. The mitral valve is grossly normal. No evidence of mitral valve regurgitation. No evidence of mitral stenosis.   4. The aortic valve is tricuspid. There is mild calcification of the aortic valve. There is mild thickening of the aortic valve. Aortic valve regurgitation is not visualized. No aortic stenosis is present.   5. The inferior vena cava is normal in size with greater than 50% respiratory variability, suggesting right atrial pressure of 3 mmHg.   6. Evidence of atrial level shunting detected by color flow Doppler in  some views.   Comparison(s): No prior Echocardiogram.   FINDINGS   Left Ventricle: Left ventricular ejection fraction, by estimation, is 65  to 70%. Left ventricular ejection fraction by 3D volume is 69 %. The left  ventricle has normal function. The left ventricle has no regional wall  motion abnormalities. The left  ventricular internal cavity size was normal in size. There is no left  ventricular hypertrophy. Left ventricular diastolic parameters are  consistent with Grade I diastolic dysfunction (impaired relaxation).  Right Ventricle: The right ventricular size is normal. No increase in  right ventricular wall thickness. Right ventricular systolic function is  normal. There is normal pulmonary artery systolic pressure. The tricuspid  regurgitant velocity is 2.17 m/s, and   with an assumed right atrial pressure of 3 mmHg, the estimated right  ventricular systolic pressure is 88.4 mmHg.   Left Atrium: Left atrial size was normal in size.   Right Atrium: Right atrial size was normal in size.   Pericardium: There is no evidence of pericardial effusion.   Mitral Valve: The mitral valve is grossly normal. Mild to moderate mitral  annular calcification. No evidence of mitral valve regurgitation. No  evidence of mitral valve stenosis.   Tricuspid Valve: The tricuspid valve is normal in structure. Tricuspid  valve regurgitation is  mild.   Aortic Valve: The aortic valve is tricuspid. There is mild calcification  of the aortic valve. There is mild thickening of the aortic valve. There  is mild aortic valve annular calcification. Aortic valve regurgitation is  not visualized. No aortic stenosis   is present.   Pulmonic Valve: The pulmonic valve was grossly normal. Pulmonic valve  regurgitation is not visualized.   Aorta: The aortic root and ascending aorta are structurally normal, with  no evidence of dilitation.   Venous: The inferior vena cava is normal in size with greater than 50%  respiratory variability, suggesting right atrial pressure of 3 mmHg.   IAS/Shunts: Evidence of atrial level shunting detected by color flow  Doppler.       Recent Labs: No results found for requested labs within last 8760 hours.  Recent Lipid Panel No results found for: CHOL, TRIG, HDL, CHOLHDL, VLDL, LDLCALC, LDLDIRECT  Physical Exam:    VS:  BP 120/60 (BP Location: Left Arm, Patient Position: Sitting, Cuff Size: Normal)    Pulse (!) 42    Ht 5\' 2"  (1.575 m)    Wt 149 lb (67.6 kg)    BMI 27.25 kg/m     Wt Readings from Last 3 Encounters:  06/22/21 149 lb (67.6 kg)  03/18/21 147 lb 6.4 oz (66.9 kg)  01/05/21 141 lb 3.2 oz (64 kg)     GEN: Well nourished, well developed in no acute distress HEENT: Normal NECK: No JVD; No carotid bruits LYMPHATICS: No lymphadenopathy CARDIAC: S1S2 noted,RRR, no murmurs, rubs, gallops RESPIRATORY:  Clear to auscultation without rales, wheezing or rhonchi  ABDOMEN: Soft, non-tender, non-distended, +bowel sounds, no guarding. EXTREMITIES: No edema, No cyanosis, no clubbing MUSCULOSKELETAL:  No deformity  SKIN: Warm and dry NEUROLOGIC:  Alert and oriented x 3, non-focal PSYCHIATRIC:  Normal affect, good insight  ASSESSMENT:    1. Deep vein thrombosis (DVT) of left lower extremity, unspecified chronicity, unspecified vein (HCC)   2. Frequent PVCs   3. PSVT (paroxysmal  supraventricular tachycardia) (HCC)    PLAN:    New DVT noted on her Doppler.  I did explain to the patient and her daughter who was present at her visit as well as her daughter on the phone about the findings.  We are going to be starting the patient on anticoagulation with Xarelto. I have educated patient on the side effects of this medication. I also urge the patient to abstain from any taking behaviors.  The patient understands that she is now at a high risk of bleeding due to being on anticoagulation.  She was also advised that if she ever falls and especially hit her head to be seen  in the emergency department.  She also has a history of frequent PVCs with 18.3% on her monitor she was given AV nodal blockers with metoprolol succinate 12.5 mg that she has significant bradycardia.  She may be a patient that would benefit from antiarrhythmics like amiodarone for the treatment of this prior to consideration for any PVC ablation.  However I will defer to her primary cardiologist.   The patient is in agreement with the above plan. The patient left the office in stable condition.  The patient will follow up in 1 week with Dr. Alvester Chou   Medication Adjustments/Labs and Tests Ordered: Current medicines are reviewed at length with the patient today.  Concerns regarding medicines are outlined above.  No orders of the defined types were placed in this encounter.  No orders of the defined types were placed in this encounter.   Patient Instructions  Medication Instructions:  Your physician has recommended you make the following change in your medication:  START: Xarelto 15 mg twice daily got 21 days then 20 mg once daily. (Starter pack)   *If you need a refill on your cardiac medications before your next appointment, please call your pharmacy*   Lab Work: None If you have labs (blood work) drawn today and your tests are completely normal, you will receive your results only by: [place] (if  you have MyChart) OR A paper copy in the mail If you have any lab test that is abnormal or we need to change your treatment, we will call you to review the results.   Testing/Procedures: None   Follow-Up: At [facility], you and your health needs are our priority.  As part of our continuing mission to provide you with exceptional heart care, we have created designated Provider Care Teams.  These Care Teams include your primary Cardiologist (physician) and Advanced Practice Providers (APPs -  Physician Assistants and Nurse Practitioners) who all work together to provide you with the care you need, when you need it.  We recommend signing up for the patient portal called "MyChart".  Sign up information is provided on this After Visit Summary.  MyChart is used to connect with patients for Virtual Visits (Telemedicine).  Patients are able to view lab/test results, encounter notes, upcoming appointments, etc.  Non-urgent messages can be sent to your provider as well.   To learn more about what you can do with MyChart, go to [email or web address].    Your next appointment:   1-2 week(s)  The format for your next appointment:   In Person  Provider:   Quay Burow, MD     Other Instructions     Adopting a Healthy Lifestyle.  Know what a healthy weight is for you (roughly BMI <25) and aim to maintain this   Aim for 7+ servings of fruits and vegetables daily   65-80+ fluid ounces of water or unsweet tea for healthy kidneys   Limit to max 1 drink of alcohol per day; avoid smoking/tobacco   Limit animal fats in diet for cholesterol and heart health - choose grass fed whenever available   Avoid highly processed foods, and foods high in saturated/trans fats   Aim for low stress - take time to unwind and care for your mental health   Aim for 150 min of moderate intensity exercise weekly for heart health, and weights twice weekly for bone health   Aim for 7-9 hours of sleep  daily   When it comes to diets, agreement about the  perfect plan isnt easy to find, even among the experts. Experts at the [employer] developed an idea known as the Healthy Eating Plate. Just imagine a plate divided into logical, healthy portions.   The emphasis is on diet quality:   Load up on vegetables and fruits - one-half of your plate: Aim for color and variety, and remember that potatoes dont count.   Go for whole grains - one-quarter of your plate: Whole wheat, barley, wheat berries, quinoa, oats, brown rice, and foods made with them. If you want pasta, go with whole wheat pasta.   Protein power - one-quarter of your plate: Fish, chicken, beans, and nuts are all healthy, versatile protein sources. Limit red meat.   The diet, however, does go beyond the plate, offering a few other suggestions.   Use healthy plant oils, such as olive, canola, soy, corn, sunflower and peanut. Check the labels, and avoid partially hydrogenated oil, which have unhealthy trans fats.   If youre thirsty, drink water. Coffee and tea are good in moderation, but skip sugary drinks and limit milk and dairy products to one or two daily servings.   The type of carbohydrate in the diet is more important than the amount. Some sources of carbohydrates, such as vegetables, fruits, whole grains, and beans-are healthier than others.   Finally, stay active  Signed, Berniece Salines, DO  06/22/2021 2:00 PM    [place]

## 2021-06-22 NOTE — Patient Instructions (Addendum)
Medication Instructions:  Your physician has recommended you make the following change in your medication:  START: Xarelto 15 mg twice daily for 21 days then 20 mg once daily. (Starter pack given)   *If you need a refill on your cardiac medications before your next appointment, please call your pharmacy*   Lab Work: Your physician recommends that you return for lab work in:  TODAY: BMET, CBC, [place] If you have labs (blood work) drawn today and your tests are completely normal, you will receive your results only by: [place] (if you have [place]) OR A paper copy in the mail If you have any lab test that is abnormal or we need to change your treatment, we will call you to review the results.   Testing/Procedures: None   Follow-Up: At [facility], you and your health needs are our priority.  As part of our continuing mission to provide you with exceptional heart care, we have created designated Provider Care Teams.  These Care Teams include your primary Cardiologist (physician) and Advanced Practice Providers (APPs -  Physician Assistants and Nurse Practitioners) who all work together to provide you with the care you need, when you need it.  We recommend signing up for the patient portal called "MyChart".  Sign up information is provided on this After Visit Summary.  MyChart is used to connect with patients for Virtual Visits (Telemedicine).  Patients are able to view lab/test results, encounter notes, upcoming appointments, etc.  Non-urgent messages can be sent to your provider as well.   To learn more about what you can do with MyChart, go to [email or web address].    Your next appointment:   1-2 week(s)  The format for your next appointment:   In Person  Provider:   Quay Burow, MD     Other Instructions

## 2021-06-22 NOTE — Telephone Encounter (Signed)
Spoke with pt's daughter (ok per DPR) regarding need for DVT ultrasound to be done today. Daughter able to get pt here for study. Daughter verbalizes understanding. Orders placed. Pt placed on schedule.

## 2021-06-23 LAB — BASIC METABOLIC PANEL
BUN/Creatinine Ratio: 19 (ref 12–28)
BUN: 18 mg/dL (ref 8–27)
CO2: 26 mmol/L (ref 20–29)
Calcium: 9.5 mg/dL (ref 8.7–10.3)
Chloride: 98 mmol/L (ref 96–106)
Creatinine, Ser: 0.97 mg/dL (ref 0.57–1.00)
Glucose: 79 mg/dL (ref 70–99)
Potassium: 4 mmol/L (ref 3.5–5.2)
Sodium: 141 mmol/L (ref 134–144)
eGFR: 60 mL/min/{1.73_m2} (ref >59)

## 2021-06-23 LAB — CBC WITH DIFFERENTIAL/PLATELET
Basophils Absolute: 0.1 10*3/uL (ref 0.0–0.2)
Basos: 1 %
EOS (ABSOLUTE): 0.3 10*3/uL (ref 0.0–0.4)
Eos: 3 %
Hematocrit: 39 % (ref 34.0–46.6)
Hemoglobin: 13.1 g/dL (ref 11.1–15.9)
Immature Grans (Abs): 0 10*3/uL (ref 0.0–0.1)
Immature Granulocytes: 0 %
Lymphocytes Absolute: 2.3 10*3/uL (ref 0.7–3.1)
Lymphs: 26 %
MCH: 29.4 pg (ref 26.6–33.0)
MCHC: 33.6 g/dL (ref 31.5–35.7)
MCV: 87 fL (ref 79–97)
Monocytes Absolute: 0.9 10*3/uL (ref 0.1–0.9)
Monocytes: 10 %
Neutrophils Absolute: 5.5 10*3/uL (ref 1.4–7.0)
Neutrophils: 60 %
Platelets: 234 10*3/uL (ref 150–450)
RBC: 4.46 x10E6/uL (ref 3.77–5.28)
RDW: 12.5 % (ref 11.7–15.4)
WBC: 9.1 10*3/uL (ref 3.4–10.8)

## 2021-06-23 LAB — MAGNESIUM: Magnesium: 2.4 mg/dL — ABNORMAL HIGH (ref 1.6–2.3)

## 2021-07-12 ENCOUNTER — Telehealth: Payer: Self-pay | Admitting: Cardiovascular Disease

## 2021-07-12 DIAGNOSIS — I82402 Acute embolism and thrombosis of unspecified deep veins of left lower extremity: Secondary | ICD-10-CM

## 2021-07-12 MED ORDER — RIVAROXABAN 20 MG PO TABS: 20.0000 mg | ORAL_TABLET | Freq: Every day | ORAL | 1 refills | Status: DC

## 2021-07-12 NOTE — Telephone Encounter (Signed)
Patient needs to continue Xarelto 15mg  twice daily until gone. Then can switch to Xarelto 20mg  once daily at dinner.  New rx sent to pharmacy

## 2021-07-12 NOTE — Telephone Encounter (Signed)
Pt c/o medication issue:  1. Name of Medication: RIVAROXABAN (XARELTO) VTE STARTER PACK (15 & 20 MG)  2. How are you currently taking this medication (dosage and times per day)? As directed  3. Are you having a reaction (difficulty breathing--STAT)?   4. What is your medication issue? Daughter needs advice for when to transition from two pills to one pill    Daughter started the 2 a day pills the evening the first day. She said the patient only has two of the 15 mg tablets left. Ideally the patient would take one 15 mg tablet tonight then the other 15 mg tablet tomorrow/  The daughter wanted to know if the patient should take the 20 mg tomorrow evening or wait unyil the next day.  Please advise

## 2021-07-13 NOTE — Telephone Encounter (Signed)
She should take the 20mg  tablet tonight and then continue 20mg  once daily at dinner

## 2021-07-13 NOTE — Telephone Encounter (Signed)
Patient daughter stated they started the medication at nighttime- so now they had her take her last 15 mg dose this morning, and she does not have any 15 mg dose for tonight because of how they started the medication. Her question is should she take the 20 mg dose tonight, or just not have one to take for the evening dose and start the 20 mg daily starting tomorrow.   Thank you!

## 2021-07-13 NOTE — Telephone Encounter (Signed)
Called patient daughter advised of message below.  Daughter verbalized understanding, thankful for call back.

## 2021-07-15 ENCOUNTER — Other Ambulatory Visit: Payer: Self-pay

## 2021-07-15 ENCOUNTER — Ambulatory Visit: Payer: PPO | Admitting: Cardiovascular Disease

## 2021-07-15 ENCOUNTER — Encounter: Payer: Self-pay | Admitting: Cardiovascular Disease

## 2021-07-15 VITALS — BP 132/60 | HR 74 | Ht 62.0 in | Wt 147.0 lb

## 2021-07-15 DIAGNOSIS — E782 Mixed hyperlipidemia: Secondary | ICD-10-CM

## 2021-07-15 DIAGNOSIS — I82402 Acute embolism and thrombosis of unspecified deep veins of left lower extremity: Secondary | ICD-10-CM

## 2021-07-15 DIAGNOSIS — I493 Ventricular premature depolarization: Secondary | ICD-10-CM

## 2021-07-15 DIAGNOSIS — I10 Essential (primary) hypertension: Secondary | ICD-10-CM | POA: Diagnosis not present

## 2021-07-15 NOTE — Assessment & Plan Note (Signed)
History of frequent PVCs demonstrated on event monitoring 03/18/2021.  She is really not symptomatic from these although she was begun on a beta-blocker which resulted in bradycardia and this was subsequently discontinued.  She has ventricular bigeminy on her twelve-lead today.

## 2021-07-15 NOTE — Assessment & Plan Note (Signed)
History of hyperlipidemia not on statin therapy with lipid profile performed 03/11/2021 revealing total cholesterol 210, LDL 140 and HDL 49.  Given her age and lack of symptoms I do not feel compelled to start her on a statin drug at this time.

## 2021-07-15 NOTE — Assessment & Plan Note (Signed)
History of essential hypertension a blood pressure measured today 132/60.  She is on valsartan and hydrochlorothiazide.

## 2021-07-15 NOTE — Progress Notes (Signed)
07/15/2021 Maria Gilmore   03-09-1941  [id number]  Primary Physician Jaclynn Major, NP Primary Cardiologist: Lorretta Harp MD Maria Gilmore, [place]  HPI:  Maria Gilmore is a 81 y.o.  thin-appearing widowed Caucasian female, mother of 2 children, grandmother of 5 grandchildren who I initially saw at the request of Dr. Vernice Gilmore 06/26/11. I last saw her in the office 08/13/2020.  She is accompanied by one of her daughters Maria Gilmore who is a Pharmacist, hospital.  She lives alone but not far from her daughter Maria Gilmore.  Her other daughter is an [employer] scientist.  She has a history of hypertension and polycystic ovarian disease on metformin. She had a Myoview stress test performed 08/26/12 which is entirely normal.    Since I saw her a year ago she did see Maria Gilmore in the office September 2022 for palpitations.  Event monitor showed frequent PVCs, and 2D echo was essentially normal.  She was begun on beta-blocker which resulted in bradycardia and this was separately discontinued.  She then saw Dr. Harriet Gilmore  in the office on 06/22/2021 for left lower extremity swelling.  Venous Doppler showed DVT.  She was begun on Xarelto and this has resolved.   Current Meds  Medication Sig   cholecalciferol (VITAMIN D3) 25 MCG (1000 UNIT) tablet Take 500 Units by mouth daily.   clonazePAM (KLONOPIN) 0.5 MG tablet Take 0.25 mg by mouth 2 (two) times daily.   donepezil (ARICEPT) 10 MG tablet Take 10 mg by mouth once.   hydrochlorothiazide (HYDRODIURIL) 25 MG tablet Take 25 mg by mouth daily.    QUEtiapine (SEROQUEL) 25 MG tablet Take 25 mg by mouth at bedtime.   rivaroxaban (XARELTO) 20 MG TABS tablet Take 1 tablet (20 mg total) by mouth daily with supper.   valsartan (DIOVAN) 80 MG tablet TAKE 1.5 TABLETS (120 MG TOTAL) BY MOUTH AT BEDTIME. *NEW DOSE (Patient taking differently: Take 80 mg by mouth daily.)   vitamin E 100 UNIT capsule Take 180 Units by mouth daily.     Allergies  Allergen Reactions   Kenalog  [Triamcinolone] Rash   Sulfa Antibiotics Rash    Social History   Socioeconomic History   Marital status: Married    Spouse name: Not on file   Number of children: 2   Years of education: 12   Highest education level: Not on file  Occupational History   Not on file  Tobacco Use   Smoking status: Never   Smokeless tobacco: Never  Substance and Sexual Activity   Alcohol use: No   Drug use: No   Sexual activity: Not on file  Other Topics Concern   Not on file  Social History Narrative   01/05/21 lives alone, dgtr < 1 mile away   Coffee, several c daily   Social Determinants of Health   Financial Resource Strain: Not on file  Food Insecurity: Not on file  Transportation Needs: Not on file  Physical Activity: Not on file  Stress: Not on file  Social Connections: Not on file  Intimate Partner Violence: Not on file     Review of Systems: General: negative for chills, fever, night sweats or weight changes.  Cardiovascular: negative for chest pain, dyspnea on exertion, edema, orthopnea, palpitations, paroxysmal nocturnal dyspnea or shortness of breath Dermatological: negative for rash Respiratory: negative for cough or wheezing Urologic: negative for hematuria Abdominal: negative for nausea, vomiting, diarrhea, bright red blood per rectum, melena, or hematemesis Neurologic: negative for visual changes,  syncope, or dizziness All other systems reviewed and are otherwise negative except as noted above.    Blood pressure 132/60, pulse 74, height 5\' 2"  (1.575 m), weight 147 lb (66.7 kg).  General appearance: alert and no distress Neck: no adenopathy, no carotid bruit, no JVD, supple, symmetrical, trachea midline, and thyroid not enlarged, symmetric, no tenderness/mass/nodules Lungs: clear to auscultation bilaterally Heart: regular rate and rhythm, S1, S2 normal, no murmur, click, rub or gallop Extremities: extremities normal, atraumatic, no cyanosis or edema Pulses: 2+ and  symmetric Skin: Skin color, texture, turgor normal. No rashes or lesions Neurologic: Grossly normal  EKG sinus rhythm at 74 with ventricular trigeminy and nonspecific ST and T wave changes.  I personally reviewed this EKG.  ASSESSMENT AND PLAN:   HTN (hypertension) History of essential hypertension a blood pressure measured today 132/60.  She is on valsartan and hydrochlorothiazide.  Hyperlipidemia History of hyperlipidemia not on statin therapy with lipid profile performed 03/11/2021 revealing total cholesterol 210, LDL 140 and HDL 49.  Given her age and lack of symptoms I do not feel compelled to start her on a statin drug at this time.  Deep vein thrombosis (DVT) of left lower extremity (HCC) History of left lower extremity DVT demonstrated on venous Doppler study 06/22/2021.  She was seen by Dr. Harriet Gilmore in our office he began her on Xarelto.  Her swelling has resolved.  We will recheck venous Doppler studies in 3 months.  Frequent PVCs History of frequent PVCs demonstrated on event monitoring 03/18/2021.  She is really not symptomatic from these although she was begun on a beta-blocker which resulted in bradycardia and this was subsequently discontinued.  She has ventricular bigeminy on her twelve-lead today.     Lorretta Harp MD FACP,FACC,FAHA, [facility] 07/15/2021 2:36 PM

## 2021-07-15 NOTE — Patient Instructions (Signed)
Medication Instructions:  Your physician recommends that you continue on your current medications as directed. Please refer to the Current Medication list given to you today.  *If you need a refill on your cardiac medications before your next appointment, please call your pharmacy*   Testing/Procedures: Your physician has requested that you have a lower extremity venous duplex. This test is an ultrasound of the veins in the legs. It looks at venous blood flow that carries blood from the heart to the legs. Allow one hour for a Lower Venous exam. There are no restrictions or special instructions. To be done in April 2023. This procedure is done at [place].   Follow-Up: At [facility], you and your health needs are our priority.  As part of our continuing mission to provide you with exceptional heart care, we have created designated Provider Care Teams.  These Care Teams include your primary Cardiologist (physician) and Advanced Practice Providers (APPs -  Physician Assistants and Nurse Practitioners) who all work together to provide you with the care you need, when you need it.  We recommend signing up for the patient portal called "MyChart".  Sign up information is provided on this After Visit Summary.  MyChart is used to connect with patients for Virtual Visits (Telemedicine).  Patients are able to view lab/test results, encounter notes, upcoming appointments, etc.  Non-urgent messages can be sent to your provider as well.   To learn more about what you can do with MyChart, go to [email or web address].    Your next appointment:   3 month(s)  The format for your next appointment:   In Person  Provider:   Quay Burow, MD

## 2021-07-15 NOTE — Assessment & Plan Note (Signed)
History of left lower extremity DVT demonstrated on venous Doppler study 06/22/2021.  She was seen by Dr. Harriet Masson in our office he began her on Xarelto.  Her swelling has resolved.  We will recheck venous Doppler studies in 3 months.

## 2021-08-09 ENCOUNTER — Ambulatory Visit: Payer: PPO | Admitting: Cardiovascular Disease

## 2021-08-13 DIAGNOSIS — R350 Frequency of micturition: Secondary | ICD-10-CM | POA: Diagnosis not present

## 2021-08-13 DIAGNOSIS — N3001 Acute cystitis with hematuria: Secondary | ICD-10-CM | POA: Diagnosis not present

## 2021-08-30 ENCOUNTER — Emergency Department (HOSPITAL_COMMUNITY): Payer: PPO

## 2021-08-30 ENCOUNTER — Emergency Department (HOSPITAL_COMMUNITY)
Admission: EM | Admit: 2021-08-30 | Discharge: 2021-08-31 | Disposition: A | Discharge status: Home / Self Care | Payer: PPO | Attending: Emergency Medicine | Admitting: Emergency Medicine

## 2021-08-30 ENCOUNTER — Telehealth: Payer: Self-pay | Admitting: Physician Assistant

## 2021-08-30 ENCOUNTER — Other Ambulatory Visit: Payer: Self-pay

## 2021-08-30 ENCOUNTER — Encounter (HOSPITAL_COMMUNITY): Payer: Self-pay | Admitting: Emergency Medicine

## 2021-08-30 DIAGNOSIS — R739 Hyperglycemia, unspecified: Secondary | ICD-10-CM | POA: Diagnosis not present

## 2021-08-30 DIAGNOSIS — F0390 Unspecified dementia without behavioral disturbance: Secondary | ICD-10-CM | POA: Insufficient documentation

## 2021-08-30 DIAGNOSIS — R001 Bradycardia, unspecified: Secondary | ICD-10-CM | POA: Diagnosis not present

## 2021-08-30 DIAGNOSIS — Z7901 Long term (current) use of anticoagulants: Secondary | ICD-10-CM | POA: Diagnosis not present

## 2021-08-30 DIAGNOSIS — R000 Tachycardia, unspecified: Secondary | ICD-10-CM | POA: Diagnosis not present

## 2021-08-30 DIAGNOSIS — R008 Other abnormalities of heart beat: Secondary | ICD-10-CM | POA: Insufficient documentation

## 2021-08-30 DIAGNOSIS — E785 Hyperlipidemia, unspecified: Secondary | ICD-10-CM | POA: Diagnosis present

## 2021-08-30 DIAGNOSIS — I959 Hypotension, unspecified: Secondary | ICD-10-CM | POA: Diagnosis not present

## 2021-08-30 DIAGNOSIS — I498 Other specified cardiac arrhythmias: Secondary | ICD-10-CM | POA: Diagnosis not present

## 2021-08-30 LAB — CBC WITH DIFFERENTIAL/PLATELET
Abs Immature Granulocytes: 0.02 10*3/uL (ref 0.00–0.07)
Basophils Absolute: 0.1 10*3/uL (ref 0.0–0.1)
Basophils Relative: 1 %
Eosinophils Absolute: 0.2 10*3/uL (ref 0.0–0.5)
Eosinophils Relative: 2 %
HCT: 45.4 % (ref 36.0–46.0)
Hemoglobin: 14.5 g/dL (ref 12.0–15.0)
Immature Granulocytes: 0 %
Lymphocytes Relative: 38 %
Lymphs Abs: 3.2 10*3/uL (ref 0.7–4.0)
MCH: 29.4 pg (ref 26.0–34.0)
MCHC: 31.9 g/dL (ref 30.0–36.0)
MCV: 92.1 fL (ref 80.0–100.0)
Monocytes Absolute: 0.7 10*3/uL (ref 0.1–1.0)
Monocytes Relative: 8 %
Neutro Abs: 4.3 10*3/uL (ref 1.7–7.7)
Neutrophils Relative %: 51 %
Platelets: 307 10*3/uL (ref 150–400)
RBC: 4.93 MIL/uL (ref 3.87–5.11)
RDW: 12.9 % (ref 11.5–15.5)
WBC: 8.4 10*3/uL (ref 4.0–10.5)
nRBC: 0 % (ref 0.0–0.2)

## 2021-08-30 LAB — PHOSPHORUS: Phosphorus: 3.3 mg/dL (ref 2.5–4.6)

## 2021-08-30 LAB — BASIC METABOLIC PANEL
Anion gap: 12 (ref 5–15)
BUN: 21 mg/dL (ref 8–23)
CO2: 27 mmol/L (ref 22–32)
Calcium: 9.4 mg/dL (ref 8.9–10.3)
Chloride: 99 mmol/L (ref 98–111)
Creatinine, Ser: 1.01 mg/dL — ABNORMAL HIGH (ref 0.44–1.00)
GFR, Estimated: 57 mL/min — ABNORMAL LOW (ref >60)
Glucose, Bld: 94 mg/dL (ref 70–99)
Potassium: 3.6 mmol/L (ref 3.5–5.1)
Sodium: 138 mmol/L (ref 135–145)

## 2021-08-30 LAB — MAGNESIUM: Magnesium: 2.2 mg/dL (ref 1.7–2.4)

## 2021-08-30 LAB — TROPONIN I (HIGH SENSITIVITY): Troponin I (High Sensitivity): 13 ng/L (ref <18)

## 2021-08-30 NOTE — Telephone Encounter (Signed)
Patient's dtr called because pt HR had registered in the 30s on BP cuff, then the cuff said it could not pick it up.   She has tried to manually check a pulse, also got one in the 30s.  Pt is currently asymptomatic. No chest pain, SOB, presyncope or syncope.  BP itself is normal.  What to do? She is frustrated because pt has had HR like this before and was told nothing needed to be done.   Pt was on BB at one point for PVCs, did not change anything >> d/c'd.  Although pt had dementia, she is functional in many ways, dtr wants her to get everything done, a "full code".  Advised that I am not comfortable with pt having an ureadable HR on the BP machine.  Advised that she should call 911. If she does not wish to do that, take her to ER or UC for eval.   Dtr says she will get her someplace for help.  Rosaria Ferries, PA-C 08/30/2021 7:15 PM

## 2021-08-30 NOTE — ED Provider Notes (Signed)
[facility] EMERGENCY DEPARTMENT Provider Note   CSN: [id number] Arrival date & time: 08/30/21  2054     History  Chief Complaint  Patient presents with   Bigeminy    Maria Gilmore is a 81 y.o. female.  The history is provided by the patient, a relative and medical records.  Maria Gilmore is a 81 y.o. female who presents to the Emergency Department complaining of bradycardia.  History is provided by the patient and her daughter.  She has a history of bradycardia, PVCs, dementia, DVT on Xarelto.  She routinely checks her blood pressure 1-2 times daily at home.  Today when she tried to check her blood pressure the home cuff would not register her heart rate.  She went to urgent care and was found to have a heart rate in the 30s to 40s with bigeminy and was referred to the emergency department.  She is asymptomatic.  She has a recording of her blood pressures and heart rates at home for the last week.  Heart rates at home have been ranging between 40 to 60s.  She did have a history of bradycardia in the past and her beta-blocker was discontinued at that time.  She currently takes valsartan, HCTZ, xarelto, seroquel.   Denies chest pain, difficulty breathing, fatigue, dizziness, fevers, nausea, vomiting.  Home Medications Prior to Admission medications   Medication Sig Start Date End Date Taking? Authorizing Provider  cholecalciferol (VITAMIN D3) 25 MCG (1000 UNIT) tablet Take 1,000 Units by mouth daily.   Yes [provider]  clonazePAM (KLONOPIN) 0.5 MG tablet Take 0.25 mg by mouth 2 (two) times daily. 12/21/20  Yes [provider]  donepezil (ARICEPT) 10 MG tablet Take 10 mg by mouth at bedtime. 07/28/20  Yes [provider]  hydrochlorothiazide (HYDRODIURIL) 25 MG tablet Take 25 mg by mouth daily.    Yes [provider]  Polyvinyl Alcohol-Povidone (REFRESH OP) Place 1 drop into both eyes daily.   Yes [provider]  QUEtiapine  (SEROQUEL) 25 MG tablet Take 25 mg by mouth at bedtime. 11/29/20  Yes [provider]  rivaroxaban (XARELTO) 20 MG TABS tablet Take 1 tablet (20 mg total) by mouth daily with supper. Patient taking differently: Take 20 mg by mouth at bedtime. 07/12/21  Yes Tobb, Kardie, DO  valsartan (DIOVAN) 80 MG tablet TAKE 1.5 TABLETS (120 MG TOTAL) BY MOUTH AT BEDTIME. *NEW DOSE Patient taking differently: Take 80 mg by mouth daily. 05/02/21  Yes Lorretta Harp, MD  vitamin E 100 UNIT capsule Take 100 Units by mouth daily.   Yes [provider]  cephALEXin (KEFLEX) 500 MG capsule Take 500 mg by mouth See admin instructions. Bid x 5 days Patient not taking: Reported on 08/30/2021 08/13/21   [provider]      Allergies    Kenalog [triamcinolone] and Sulfa antibiotics    Review of Systems   Review of Systems  All other systems reviewed and are negative.  Physical Exam Updated Vital Signs BP (!) 156/84 (BP Location: Right Arm)    Pulse 74    Temp 98.3 F (36.8 C) (Oral)    Resp 16    SpO2 100%  Physical Exam Vitals and nursing note reviewed.  Constitutional:      Appearance: She is well-developed.  HENT:     Head: Normocephalic and atraumatic.  Cardiovascular:     Rate and Rhythm: Regular rhythm. Bradycardia present.     Heart sounds: No murmur  heard. Pulmonary:     Effort: Pulmonary effort is normal. No respiratory distress.     Breath sounds: Normal breath sounds.  Abdominal:     Palpations: Abdomen is soft.     Tenderness: There is no abdominal tenderness. There is no guarding or rebound.  Musculoskeletal:        General: No tenderness.  Skin:    General: Skin is warm and dry.  Neurological:     Mental Status: She is alert.     Comments: MAE symmetrically.  Difficulty with word finding  Psychiatric:        Behavior: Behavior normal.    ED Results / Procedures / Treatments   Labs (all labs ordered are listed, but only abnormal results are displayed) Labs  Reviewed  BASIC METABOLIC PANEL - Abnormal; Notable for the following components:      Result Value   Creatinine, Ser 1.01 (*)    GFR, Estimated 57 (*)    All other components within normal limits  TROPONIN I (HIGH SENSITIVITY) - Abnormal; Notable for the following components:   Troponin I (High Sensitivity) 18 (*)    All other components within normal limits  CBC WITH DIFFERENTIAL/PLATELET  MAGNESIUM  PHOSPHORUS  TROPONIN I (HIGH SENSITIVITY)    EKG EKG Interpretation  Date/Time:  Tuesday August 30 2021 21:02:16 EST Ventricular Rate:  72 PR Interval:    QRS Duration: 78 QT Interval:  424 QTC Calculation: 464 R Axis:   -5 Text Interpretation: Sinus rhythm Otherwise normal ECG  multiple pvcs Confirmed by Quintella Reichert [phone]) on 08/30/2021 11:05:34 PM  Radiology DG Chest 1 View  Result Date: 08/30/2021 CLINICAL DATA:  Bigeminy EXAM: CHEST  1 VIEW COMPARISON:  08/05/2012 FINDINGS: The heart size and mediastinal contours are within normal limits. Both lungs are clear. The visualized skeletal structures are unremarkable. IMPRESSION: No active disease. Electronically Signed   By: Inez Catalina M.D.   On: 08/30/2021 21:35    Procedures Procedures    Medications Ordered in ED Medications - No data to display  ED Course/ Medical Decision Making/ A&P                           Medical Decision Making  Patient with history of bradycardia, dementia, hypertension, DVT on anticoagulation here for evaluation of bradycardia on outpatient vital sign check at home.  She is asymptomatic.  EKG with bigeminy.  She does have frequent PVCs on the monitor.  Perfusing heart rate averages around 40 bpm.  She appears to be relatively asymptomatic with this.  She is not on any nodal blocking agents.  Her labs are stable when compared to priors.  Discussed with cardiologist on-call, who evaluated the patient in the emergency department.  Given the patient is perfusing well and asymptomatic feel that  she is stable for discharge home with outpatient cardiology follow-up.  Close return precautions discussed.        Final Clinical Impression(s) / ED Diagnoses Final diagnoses:  Bigeminy  Bradycardia    Rx / DC Orders ED Discharge Orders     None         Quintella Reichert, MD 08/31/21 [phone]

## 2021-08-30 NOTE — ED Triage Notes (Signed)
Patient arrived with EMS from an urgent care , reports bigeminy today , daughter reported low pulse at home 40's this evening , denies chest pain , no palpitations / respirations unlabored, patient has dementia/poor historian .

## 2021-08-30 NOTE — ED Notes (Signed)
ED Provider at bedside. 

## 2021-08-30 NOTE — ED Provider Triage Note (Signed)
Emergency Medicine Provider Triage Evaluation Note  Maria Gilmore , a 81 y.o. female  was evaluated in triage.  Patient states that today she was trying to check her blood pressure and noticed that it was reading low and she was concerned it was not working right.  She called her daughter and she came to check on her.  The problem was and that the blood pressure cuff was not working, it was that her heart rate was reading very low.  Every time they measured her manual heart rate it was in the 30s.  She presented to the urgent care for an EKG on referral of her cardiologist.  EKG revealed sinus rhythm with bigeminy PVCs.  I asked patient if she felt abnormal today, and she said that while she was at the senior center, she started feeling some abnormal chest discomfort.  She could not really articulate what this felt like, except that it felt like she could not catch a deep breath.  At this time, she is asymptomatic.  Review of Systems  Positive:  Negative:   Physical Exam  BP (!) 152/62 (BP Location: Right Arm)    Pulse 75    Temp 98.4 F (36.9 C) (Oral)    Resp 17    SpO2 99%  Gen:   Awake, no distress   Resp:  Normal effort  MSK:   Moves extremities without difficulty  Other:  Heart RRR, No murmurs. Lungs CTA. No abdominal ttp.   Medical Decision Making  Medically screening exam initiated at 9:09 PM.  Appropriate orders placed.  Maria Gilmore was informed that the remainder of the evaluation will be completed by another provider, this initial triage assessment does not replace that evaluation, and the importance of remaining in the ED until their evaluation is complete.  Will r/o ACS presentation. I reviewed EKG from Urgent Care. Repeat will be sent.    Maria Gilmore, [place] 08/30/21 2111

## 2021-08-31 DIAGNOSIS — R001 Bradycardia, unspecified: Secondary | ICD-10-CM | POA: Diagnosis present

## 2021-08-31 LAB — TROPONIN I (HIGH SENSITIVITY): Troponin I (High Sensitivity): 18 ng/L — ABNORMAL HIGH (ref <18)

## 2021-08-31 NOTE — Consult Note (Signed)
Cardiology Consultation:   Patient ID: Maria Gilmore MRN: [id number]; DOB: 1941/05/14  Admit date: 08/30/2021 Date of Consult: 08/31/2021  Primary Care Provider: Jaclynn Major, NP [facility] Cardiologist: Quay Burow, MD  [place] Electrophysiologist:  None    Patient Profile:   Maria Gilmore is a 81 y.o. female with a hx of HLD, HTN, DVT diagnosed 06/22/21 on xarelto, who is being seen today for the evaluation of Bradycardia at the request of Dr Quintella Reichert.  History of Present Illness:   [name] was in her normal state of health, and on her daily blood pressure checks her BP cuff would not read a heart rate. The patient's daughter called Maria Ferries, PA here, who recommended she be evaluated at urgent care or the emergency department.  Patient reports being completely asymptomatic during this episode.  She denied lightheadedness, palpitations, presyncope, syncope.  Reports no changes to her health in the prior days, no new medications, no new recent sicknesses or illnesses.  Was not aware that she had a low heart rate.  She continued to [facility] to the senior center for social activities today without complications.  The patient's daughters Maria Gilmore and Maria Gilmore confirmed this history.  On presentation to the emergency department, found to have bigeminy on telemetry with sinus beats followed by PVCs.   Past Medical History:  Diagnosis Date   Dementia ([place])    Hypertension    Memory loss    Polycystic disease, ovaries    Swelling of left lower extremity 11/28/2011   LEV-no evidence of dvt,super. thrombosis    Past Surgical History:  Procedure Laterality Date   ABDOMINAL HYSTERECTOMY     CHOLECYSTECTOMY     exerc stress test     nuclear stress test  08/16/2012   EF 80%; NL LV FX,NL wall motion     Home Medications:  Prior to Admission medications   Medication Sig Start Date End Date Taking? Authorizing Provider  cholecalciferol (VITAMIN D3) 25 MCG (1000  UNIT) tablet Take 1,000 Units by mouth daily.   Yes [provider]  clonazePAM (KLONOPIN) 0.5 MG tablet Take 0.25 mg by mouth 2 (two) times daily. 12/21/20  Yes [provider]  donepezil (ARICEPT) 10 MG tablet Take 10 mg by mouth at bedtime. 07/28/20  Yes [provider]  hydrochlorothiazide (HYDRODIURIL) 25 MG tablet Take 25 mg by mouth daily.    Yes [provider]  Polyvinyl Alcohol-Povidone (REFRESH OP) Place 1 drop into both eyes daily.   Yes [provider]  QUEtiapine (SEROQUEL) 25 MG tablet Take 25 mg by mouth at bedtime. 11/29/20  Yes [provider]  rivaroxaban (XARELTO) 20 MG TABS tablet Take 1 tablet (20 mg total) by mouth daily with supper. Patient taking differently: Take 20 mg by mouth at bedtime. 07/12/21  Yes Tobb, Kardie, DO  valsartan (DIOVAN) 80 MG tablet TAKE 1.5 TABLETS (120 MG TOTAL) BY MOUTH AT BEDTIME. *NEW DOSE Patient taking differently: Take 80 mg by mouth daily. 05/02/21  Yes Lorretta Harp, MD  vitamin E 100 UNIT capsule Take 100 Units by mouth daily.   Yes [provider]  cephALEXin (KEFLEX) 500 MG capsule Take 500 mg by mouth See admin instructions. Bid x 5 days Patient not taking: Reported on 08/30/2021 08/13/21   [provider]    Inpatient Medications: Scheduled Meds:  Continuous Infusions:  PRN Meds:   Allergies:    Allergies  Allergen Reactions   Kenalog [Triamcinolone] Rash   Sulfa Antibiotics Rash  Social History:   Social History   Socioeconomic History   Marital status: Married    Spouse name: Not on file   Number of children: 2   Years of education: 12   Highest education level: Not on file  Occupational History   Not on file  Tobacco Use   Smoking status: Never   Smokeless tobacco: Never  Substance and Sexual Activity   Alcohol use: No   Drug use: No   Sexual activity: Not on file  Other Topics Concern   Not on file  Social History Narrative   01/05/21  lives alone, dgtr < 1 mile away   Coffee, several c daily   Social Determinants of Health   Financial Resource Strain: Not on file  Food Insecurity: Not on file  Transportation Needs: Not on file  Physical Activity: Not on file  Stress: Not on file  Social Connections: Not on file  Intimate Partner Violence: Not on file    Family History:   Father had prostatic cancer. Mother had dementia.  Family History  Problem Relation Age of Onset   Thyroid disease Mother    Dementia Mother    Hypertension Father    Prostate cancer Father      ROS:  Review of Systems: [y] = yes, [ ]  = no      General: Weight gain [ ] ; Weight loss [ ] ; Anorexia [ ] ; Fatigue [ ] ; Fever [ ] ; Chills [ ] ; Weakness [ ]    Cardiac: Chest pain/pressure [ ] ; Resting SOB [ ] ; Exertional SOB [ ] ; Orthopnea [ ] ; Pedal Edema [ ] ; Palpitations [ ] ; Syncope [ ] ; Presyncope [ ] ; Paroxysmal nocturnal dyspnea [ ]    Pulmonary: Cough [ ] ; Wheezing [ ] ; Hemoptysis [ ] ; Sputum [ ] ; Snoring [ ]    GI: Vomiting [ ] ; Dysphagia [ ] ; Melena [ ] ; Hematochezia [ ] ; Heartburn [ ] ; Abdominal pain [ ] ; Constipation [ ] ; Diarrhea [ ] ; BRBPR [ ]    GU: Hematuria [ ] ; Dysuria [ ] ; Nocturia [ ]  Vascular: Pain in legs with walking [ ] ; Pain in feet with lying flat [ ] ; Non-healing sores [ ] ; Stroke [ ] ; TIA [ ] ; Slurred speech [ ] ;   Neuro: Headaches [ ] ; Vertigo [ ] ; Seizures [ ] ; Paresthesias [ ] ;Blurred vision [ ] ; Diplopia [ ] ; Vision changes [ ]    Ortho/Skin: Arthritis [ ] ; Joint pain [ ] ; Muscle pain [ ] ; Joint swelling [ ] ; Back Pain [ ] ; Rash [ ]    Psych: Depression [ ] ; Anxiety [ ] ; Forgetfulness [y]   Heme: Bleeding problems [ ] ; Clotting disorders [ ] ; Anemia [ ]    Endocrine: Diabetes [ ] ; Thyroid dysfunction [ ]    Physical Exam/Data:   Vitals:   08/30/21 2101 08/30/21 2330 08/31/21 0000 08/31/21 0030  BP: (!) 152/62 (!) 163/64 (!) 142/65 (!) 158/68  Pulse: 75 83 (!) 59 67  Resp: 17 (!) 27 13 17   Temp: 98.4 F (36.9 C)      TempSrc: Oral     SpO2: 99% 99% 98% 100%   No intake or output data in the 24 hours ending 08/31/21 0047 Last 3 Weights 07/15/2021 06/22/2021 03/18/2021  Weight (lbs) 147 lb 149 lb 147 lb 6.4 oz  Weight (kg) 66.679 kg 67.586 kg 66.86 kg     There is no height or weight on file to calculate BMI.   General:  Well nourished, well developed, in no acute distress, pleasant caucasian  elderly female HEENT: normal Lymph: no adenopathy Neck: no JVD Endocrine:  No thryomegaly Vascular: No carotid bruits; FA pulses 2+ bilaterally without bruits  Cardiac:  normal S1, S2; RRR; no murmur, frequent extra beats ascultated, Radial pulses 2+ with sinus beats on telemetry, with PVCs thready vs no pulse palpitated depending on PVC timing. Lungs:  clear to auscultation bilaterally, no wheezing, rhonchi or rales  Abd: soft, nontender, no hepatomegaly  Ext: no edema Musculoskeletal:  No deformities, BUE and BLE strength normal and equal Skin: warm and dry  Neuro:  CNs 2-12 intact, no focal abnormalities noted Psych:  Normal affect   EKG:  The EKG was personally reviewed and demonstrates:  NSR with PVCs in bigemity to trigeminy patter, HR 72 Telemetry:  Telemetry was personally reviewed and demonstrates:  HR 50s-60s by electrical signal, pulse oximetry reveals minimal effective pulsation (barely visible peak) during PVCs leading to effect HR in the 40s-60s  Relevant CV Studies:  TTE (04/22/21): IMPRESSIONS   1. Left ventricular ejection fraction, by estimation, is 65 to 70%. Left  ventricular ejection fraction by 3D volume is 69 %. The left ventricle has  normal function. The left ventricle has no regional wall motion  abnormalities. Left ventricular diastolic   parameters are consistent with Grade I diastolic dysfunction (impaired  relaxation).   2. Right ventricular systolic function is normal. The right ventricular  size is normal. There is normal pulmonary artery systolic pressure.   3. The mitral  valve is grossly normal. No evidence of mitral valve  regurgitation. No evidence of mitral stenosis.   4. The aortic valve is tricuspid. There is mild calcification of the  aortic valve. There is mild thickening of the aortic valve. Aortic valve  regurgitation is not visualized. No aortic stenosis is present.   5. The inferior vena cava is normal in size with greater than 50%  respiratory variability, suggesting right atrial pressure of 3 mmHg.   6. Evidence of atrial level shunting detected by color flow Doppler in  some views.   Laboratory Data:  High Sensitivity Troponin:   Recent Labs  Lab 08/30/21 2117 08/30/21 2309  TROPONINIHS 13 18*     Chemistry Recent Labs  Lab 08/30/21 2117  NA 138  K 3.6  CL 99  CO2 27  GLUCOSE 94  BUN 21  CREATININE 1.01*  CALCIUM 9.4  GFRNONAA 57*  ANIONGAP 12    No results for input(s): PROT, ALBUMIN, AST, ALT, ALKPHOS, BILITOT in the last 168 hours. Hematology Recent Labs  Lab 08/30/21 2117  WBC 8.4  RBC 4.93  HGB 14.5  HCT 45.4  MCV 92.1  MCH 29.4  MCHC 31.9  RDW 12.9  PLT 307   BNPNo results for input(s): BNP, PROBNP in the last 168 hours.  DDimer No results for input(s): DDIMER in the last 168 hours.  Radiology/Studies:  DG Chest 1 View  Result Date: 08/30/2021 CLINICAL DATA:  Bigeminy EXAM: CHEST  1 VIEW COMPARISON:  08/05/2012 FINDINGS: The heart size and mediastinal contours are within normal limits. Both lungs are clear. The visualized skeletal structures are unremarkable. IMPRESSION: No active disease. Electronically Signed   By: Inez Catalina M.D.   On: 08/30/2021 21:35   {   Assessment and Plan:   Seairra Otani is a 81 y.o. female with a hx of HLD, HTN, DVT diagnosed 06/22/21 on xarelto, who is being seen today for the evaluation of Bradycardia   Asymptomatic Bradycardia, 2/2 Frequent PVCs Presented for bradycardia at  home, telemetry revealing normal sinus rhythm with PVCs occurring every 2-7 beats, with periods  of bigeminy.  With PVCs, she has significantly reduced pulse pressure as evident by weak radial pulse during PVCs along with greatly reduced pulse ox (co-oximetry) pulsation on telemetry.  This effectively led to her home blood pressure cuff not measuring electrocardiographic PVCs as heartbeats, leading to an unmeasurable heart rate.  Also her PVCs are lowering her effective sinus rate due to resetting of her sinus node.  She is completely asymptomatic, and thus does not currently have a class I indication for a pacemaker.  Her heart rates electrographically on telemetry are in the 40s-60s. However, would likely benefit from more aggressive control of her PVCs.  Not historically tolerated beta-blockers for PVCs, but could be trialed on amiodarone as an antiarrhythmic in the outpatient setting.  PVCs are of single morphology, so could potentially use ablation as treatment, however unclear how this fits in with her goals of care, age, and dementia but she is asymptomatic.   -Okay for discharge from the emergency department. No indications for admission at this time. -No changes to medications -Wound defer PVC management to her primary cardiologist Dr. Quay Burow as he has been working on [facility] management with her for this previously. -Recommend follow-up with primary cardiologist in 7-21 days, patient's family to schedule, for PVC management -Strict return precautions for bradycardia including lightheadedness, dizziness, chest pain, presyncope, syncope.  Explained these to the patient and her daughters.   [facility] will sign off.   Medication Recommendations:  no changes to home medicaitons Other recommendations (labs, testing, etc):  no further testing needed Follow up as an outpatient:  f/u needs to be scheduled with cardiologist Dr Quay Burow in next 7-21 days  For questions or updates, please contact [place] Please consult [email or web address] for contact info under     Signed, Bronson Curb, MD  08/31/2021 12:47 AM

## 2021-08-31 NOTE — Telephone Encounter (Signed)
Spoke with daughter who reports she took patient to urgent care and then transferred to ED for bigeminy. She needs an appointment with Dr. Gwenlyn Found within the next week or 2. Please call daughter to schedule.

## 2021-08-31 NOTE — ED Notes (Signed)
All discharge instructions including follow up care reviewed with patient and patient's daughter at bedside. Both verbalized understanding and had no other questions. Patient stable and ambulatory at time of discharge.

## 2021-08-31 NOTE — Telephone Encounter (Signed)
Spoke with daughter to inform her of appointment with Dr. Gwenlyn Found for 3/1 @ 4:30 pm.

## 2021-09-07 ENCOUNTER — Other Ambulatory Visit: Payer: Self-pay

## 2021-09-07 ENCOUNTER — Ambulatory Visit: Payer: PPO | Admitting: Cardiovascular Disease

## 2021-09-07 ENCOUNTER — Encounter: Payer: Self-pay | Admitting: Cardiovascular Disease

## 2021-09-07 VITALS — BP 150/84 | HR 66 | Ht 61.0 in | Wt 146.8 lb

## 2021-09-07 DIAGNOSIS — E782 Mixed hyperlipidemia: Secondary | ICD-10-CM

## 2021-09-07 DIAGNOSIS — I10 Essential (primary) hypertension: Secondary | ICD-10-CM

## 2021-09-07 DIAGNOSIS — I493 Ventricular premature depolarization: Secondary | ICD-10-CM

## 2021-09-07 DIAGNOSIS — I471 Supraventricular tachycardia: Secondary | ICD-10-CM

## 2021-09-07 DIAGNOSIS — I82512 Chronic embolism and thrombosis of left femoral vein: Secondary | ICD-10-CM

## 2021-09-07 NOTE — Assessment & Plan Note (Signed)
History of frequent PVCs with an 18% burden by event monitoring performed 03/18/2021.  She is completely asymptomatic.  She was briefly started on a beta-blocker which resulted in bradycardia.  At this point, I do not think this requires further evaluation or treatment. ?

## 2021-09-07 NOTE — Assessment & Plan Note (Signed)
History of hyperlipidemia not on statin therapy with lipid profile performed 03/11/2021 revealing total cholesterol of 210, LDL of 140 and HDL 49.  I am going to order a coronary calcium score to help guide how aggressive to be with lipid-lowering medications. ?

## 2021-09-07 NOTE — Assessment & Plan Note (Signed)
History of essential hypertension a blood pressure measured today at 150/84.  When reviewing her blood pressure log from home blood pressures have been much lower this for the most part.  She is on hydrochlorothiazide and valsartan. ?

## 2021-09-07 NOTE — Assessment & Plan Note (Signed)
History of left lower extremity swelling with venous Doppler suggesting a DVT on 06/22/2021.  She saw Dr. Harriet Masson in the office who evaluated her and placed her on Xarelto.  Her swelling has resolved.  She has a venous Doppler studies scheduled for next month.  If it shows resolution of her DVT we can stop the Xarelto. ?

## 2021-09-07 NOTE — Patient Instructions (Signed)
Medication Instructions:  °Your physician recommends that you continue on your current medications as directed. Please refer to the Current Medication list given to you today. ° °*If you need a refill on your cardiac medications before your next appointment, please call your pharmacy* ° ° °Testing/Procedures: °Dr. Berry has ordered a CT coronary calcium score.  ° °Test location:  °[facility] ([address] [place], [place] [address]) ° °This is $99 out of pocket. ° ° °Coronary CalciumScan °A coronary calcium scan is an imaging test used to look for deposits of calcium and other fatty materials (plaques) in the inner lining of the blood vessels of the heart (coronary arteries). These deposits of calcium and plaques can partly clog and narrow the coronary arteries without producing any symptoms or warning signs. This puts a person at risk for a heart attack. This test can detect these deposits before symptoms develop. °Tell a health care provider about: °Any allergies you have. °All medicines you are taking, including vitamins, herbs, eye drops, creams, and over-the-counter medicines. °Any problems you or family members have had with anesthetic medicines. °Any blood disorders you have. °Any surgeries you have had. °Any medical conditions you have. °Whether you are pregnant or may be pregnant. °What are the risks? °Generally, this is a safe procedure. However, problems may occur, including: °Harm to a pregnant woman and her unborn baby. This test involves the use of radiation. Radiation exposure can be dangerous to a pregnant woman and her unborn baby. If you are pregnant, you generally should not have this procedure done. °Slight increase in the risk of cancer. This is because of the radiation involved in the test. °What happens before the procedure? °No preparation is needed for this procedure. °What happens during the procedure? °You will undress and remove any jewelry around your neck or chest. °You will  put on a hospital gown. °Sticky electrodes will be placed on your chest. The electrodes will be connected to an electrocardiogram (ECG) machine to record a tracing of the electrical activity of your heart. °A CT scanner will take pictures of your heart. During this time, you will be asked to lie still and hold your breath for 2-3 seconds while a picture of your heart is being taken. °The procedure may vary among health care providers and hospitals. °What happens after the procedure? °You can get dressed. °You can return to your normal activities. °It is up to you to get the results of your test. Ask your health care provider, or the department that is doing the test, when your results will be ready. °Summary °A coronary calcium scan is an imaging test used to look for deposits of calcium and other fatty materials (plaques) in the inner lining of the blood vessels of the heart (coronary arteries). °Generally, this is a safe procedure. Tell your health care provider if you are pregnant or may be pregnant. °No preparation is needed for this procedure. °A CT scanner will take pictures of your heart. °You can return to your normal activities after the scan is done. °This information is not intended to replace advice given to you by your health care provider. Make sure you discuss any questions you have with your health care provider. °Document Released: 12/23/2007 Document Revised: 05/15/2016 Document Reviewed: 05/15/2016 °Elsevier Interactive Patient Education © 2017 Elsevier Inc[employer] ° ° ° °Follow-Up: °At [facility], you and your health needs are our priority.  As part of our continuing mission to provide you with exceptional heart care,   we have created designated Provider Care Teams.  These Care Teams include your primary Cardiologist (physician) and Advanced Practice Providers (APPs -  Physician Assistants and Nurse Practitioners) who all work together to provide you with the care you need, when you need it. ° °We  recommend signing up for the patient portal called "MyChart".  Sign up information is provided on this After Visit Summary.  MyChart is used to connect with patients for Virtual Visits (Telemedicine).  Patients are able to view lab/test results, encounter notes, upcoming appointments, etc.  Non-urgent messages can be sent to your provider as well.   °To learn more about what you can do with MyChart, go to https://www.mychart.com.   ° °Your next appointment:   °12 month(s) ° °The format for your next appointment:   °In Person ° °Provider:   °Jonathan Berry, MD °

## 2021-09-07 NOTE — Progress Notes (Signed)
? ? ? ?09/07/2021 ?Maria Gilmore   ?Feb 16, 1941  ?[id number] ? ?Primary Physician Jaclynn Major, NP ?Primary Cardiologist: Lorretta Harp MD Maria Gilmore, [place] ? ?HPI:  Maria Gilmore is a 81 y.o.  thin-appearing widowed Caucasian female, mother of 2 children, grandmother of 5 grandchildren who I initially saw at the request of Dr. Vernice Jefferson 06/26/11. I last saw her in the office 07/15/2021.  She is accompanied by one of her daughter Maria Gilmore who is a Pharmacist, hospital and her daughter Maria Gilmore who is an OR Marine scientist at [employer]..  She lives alone but not far from her daughter Maria Gilmore.    She has a history of hypertension and polycystic ovarian disease on metformin. She had a Myoview stress test performed 08/26/12 which is entirely normal.  ?  ?Since I saw her a year ago she did see [name] in the office September 2022 for palpitations.  Event monitor showed frequent PVCs, and 2D echo was essentially normal.  She was begun on beta-blocker which resulted in bradycardia and this was separately discontinued.  She then saw Dr. Harriet Masson  in the office on 06/22/2021 for left lower extremity swelling.  Venous Doppler showed DVT.  She was begun on Xarelto and this has resolved. ? ?Since I saw her back in January she has done well.  Her swelling has resolved.  She again denies any symptoms related to her PVCs.  She has a venous Doppler study scheduled for next month. ? ? ?Current Meds  ?Medication Sig  ? cholecalciferol (VITAMIN D3) 25 MCG (1000 UNIT) tablet Take 1,000 Units by mouth daily.  ? clonazePAM (KLONOPIN) 0.5 MG tablet Take 0.25 mg by mouth 2 (two) times daily.  ? donepezil (ARICEPT) 10 MG tablet Take 10 mg by mouth at bedtime.  ? hydrochlorothiazide (HYDRODIURIL) 25 MG tablet Take 25 mg by mouth daily.   ? Polyvinyl Alcohol-Povidone (REFRESH OP) Place 1 drop into both eyes daily.  ? QUEtiapine (SEROQUEL) 25 MG tablet Take 25 mg by mouth at bedtime.  ? rivaroxaban (XARELTO) 20 MG TABS tablet Take 1 tablet (20 mg total) by  mouth daily with supper. (Patient taking differently: Take 20 mg by mouth at bedtime.)  ? valsartan (DIOVAN) 80 MG tablet TAKE 1.5 TABLETS (120 MG TOTAL) BY MOUTH AT BEDTIME. *NEW DOSE (Patient taking differently: Take 80 mg by mouth daily.)  ? vitamin E 100 UNIT capsule Take 100 Units by mouth daily.  ?  ? ?Allergies  ?Allergen Reactions  ? Kenalog [Triamcinolone] Rash  ? Sulfa Antibiotics Rash  ? ? ?Social History  ? ?Socioeconomic History  ? Marital status: Married  ?  Spouse name: Not on file  ? Number of children: 2  ? Years of education: 8  ? Highest education level: Not on file  ?Occupational History  ? Not on file  ?Tobacco Use  ? Smoking status: Never  ? Smokeless tobacco: Never  ?Substance and Sexual Activity  ? Alcohol use: No  ? Drug use: No  ? Sexual activity: Not on file  ?Other Topics Concern  ? Not on file  ?Social History Narrative  ? 01/05/21 lives alone, dgtr < 1 mile away  ? Coffee, several c daily  ? ?Social Determinants of Health  ? ?Financial Resource Strain: Not on file  ?Food Insecurity: Not on file  ?Transportation Needs: Not on file  ?Physical Activity: Not on file  ?Stress: Not on file  ?Social Connections: Not on file  ?Intimate Partner Violence: Not on file  ?  ? ?  Review of Systems: ?General: negative for chills, fever, night sweats or weight changes.  ?Cardiovascular: negative for chest pain, dyspnea on exertion, edema, orthopnea, palpitations, paroxysmal nocturnal dyspnea or shortness of breath ?Dermatological: negative for rash ?Respiratory: negative for cough or wheezing ?Urologic: negative for hematuria ?Abdominal: negative for nausea, vomiting, diarrhea, bright red blood per rectum, melena, or hematemesis ?Neurologic: negative for visual changes, syncope, or dizziness ?All other systems reviewed and are otherwise negative except as noted above. ? ? ? ?Blood pressure (!) 150/84, pulse 66, height 5\' 1"  (1.549 m), weight 146 lb 12.8 oz (66.6 kg), SpO2 99 %.  ?General appearance:  alert and no distress ?Neck: no adenopathy, no carotid bruit, no JVD, supple, symmetrical, trachea midline, and thyroid not enlarged, symmetric, no tenderness/mass/nodules ?Lungs: clear to auscultation bilaterally ?Heart: regular rate and rhythm, S1, S2 normal, no murmur, click, rub or gallop ?Extremities: extremities normal, atraumatic, no cyanosis or edema ?Pulses: 2+ and symmetric ?Skin: Skin color, texture, turgor normal. No rashes or lesions ?Neurologic: Grossly normal ? ?EKG sinus rhythm at 66 without ST or T wave changes.  I personally reviewed this EKG. ? ?ASSESSMENT AND PLAN:  ? ?HTN (hypertension) ?History of essential hypertension a blood pressure measured today at 150/84.  When reviewing her blood pressure log from home blood pressures have been much lower this for the most part.  She is on hydrochlorothiazide and valsartan. ? ?Hyperlipidemia ?History of hyperlipidemia not on statin therapy with lipid profile performed 03/11/2021 revealing total cholesterol of 210, LDL of 140 and HDL 49.  I am going to order a coronary calcium score to help guide how aggressive to be with lipid-lowering medications. ? ?Deep vein thrombosis (DVT) of left lower extremity (HCC) ?History of left lower extremity swelling with venous Doppler suggesting a DVT on 06/22/2021.  She saw Dr. Harriet Masson in the office who evaluated her and placed her on Xarelto.  Her swelling has resolved.  She has a venous Doppler studies scheduled for next month.  If it shows resolution of her DVT we can stop the Xarelto. ? ?Frequent PVCs ?History of frequent PVCs with an 18% burden by event monitoring performed 03/18/2021.  She is completely asymptomatic.  She was briefly started on a beta-blocker which resulted in bradycardia.  At this point, I do not think this requires further evaluation or treatment. ? ? ? ? ?Lorretta Harp MD FACP,FACC,FAHA, FSCAI ?09/07/2021 ?4:41 PM ?

## 2021-09-12 DIAGNOSIS — G309 Alzheimer's disease, unspecified: Secondary | ICD-10-CM | POA: Diagnosis not present

## 2021-09-12 DIAGNOSIS — F0280 Dementia in other diseases classified elsewhere without behavioral disturbance: Secondary | ICD-10-CM | POA: Diagnosis not present

## 2021-09-12 DIAGNOSIS — I82402 Acute embolism and thrombosis of unspecified deep veins of left lower extremity: Secondary | ICD-10-CM | POA: Diagnosis not present

## 2021-09-12 DIAGNOSIS — R001 Bradycardia, unspecified: Secondary | ICD-10-CM | POA: Diagnosis not present

## 2021-09-28 DIAGNOSIS — L814 Other melanin hyperpigmentation: Secondary | ICD-10-CM | POA: Diagnosis not present

## 2021-09-28 DIAGNOSIS — L821 Other seborrheic keratosis: Secondary | ICD-10-CM | POA: Diagnosis not present

## 2021-09-28 DIAGNOSIS — D1801 Hemangioma of skin and subcutaneous tissue: Secondary | ICD-10-CM | POA: Diagnosis not present

## 2021-10-17 ENCOUNTER — Ambulatory Visit (INDEPENDENT_AMBULATORY_CARE_PROVIDER_SITE_OTHER)
Admission: RE | Admit: 2021-10-17 | Discharge: 2021-10-17 | Disposition: A | Discharge status: Home / Self Care | Payer: Self-pay | Source: Ambulatory Visit | Attending: Cardiovascular Disease | Admitting: Cardiovascular Disease

## 2021-10-17 DIAGNOSIS — E782 Mixed hyperlipidemia: Secondary | ICD-10-CM

## 2021-10-19 ENCOUNTER — Ambulatory Visit (HOSPITAL_COMMUNITY)
Admission: RE | Admit: 2021-10-19 | Discharge: 2021-10-19 | Disposition: A | Discharge status: Home / Self Care | Payer: PPO | Source: Ambulatory Visit | Attending: Cardiology | Admitting: Cardiology

## 2021-10-19 DIAGNOSIS — I82402 Acute embolism and thrombosis of unspecified deep veins of left lower extremity: Secondary | ICD-10-CM | POA: Diagnosis not present

## 2021-10-21 ENCOUNTER — Telehealth: Payer: Self-pay | Admitting: Cardiovascular Disease

## 2021-10-21 NOTE — Telephone Encounter (Signed)
Spoke with pt's daughter [name] (ok per [facility]) about Dr. Victorino December recommendations. Pt will continue to take xarelto for 3 more months. Pt does have a refill on prescription and daughter will have this filled tomorrow. Follow up appointment scheduled. Wendy verbalizes understanding. ?

## 2021-10-21 NOTE — Telephone Encounter (Signed)
Pt c/o medication issue: ? ?1. Name of Medication: rivaroxaban (XARELTO) 20 MG TABS tablet ? ?2. How are you currently taking this medication (dosage and times per day)? Take 1 tablet (20 mg total) by mouth daily with supper.Patient taking differently: Take 20 mg by mouth at bedtime. ? ?3. Are you having a reaction (difficulty breathing--STAT)? no ? ?4. What is your medication issue? Daughter is calling to see if the patient is suppose to continue with the medication since there was no blood clot. If so they need a prescription sent in. Please advise ? ?

## 2021-10-21 NOTE — Telephone Encounter (Signed)
Dr.Berry primary nurse completed call. Documentation to follow.  ? ?

## 2021-10-21 NOTE — Telephone Encounter (Signed)
Would recommend a total of 6 months of anticoagulation even if DVT has resolved ?

## 2021-10-21 NOTE — Telephone Encounter (Signed)
Contacted patient daughter- she states since DVT was resolved, should her mother continue the Xarelto, she states her last pill is tomorrow, and she would like to know what needs to be done, if she should continue she needs RX sent to pharmacy, advised Dr.Berry was out of office but would route to covering MD to review.  ? ?Thank you!  ? ?

## 2021-11-01 ENCOUNTER — Ambulatory Visit: Payer: PPO | Admitting: Cardiovascular Disease

## 2021-12-02 DIAGNOSIS — R410 Disorientation, unspecified: Secondary | ICD-10-CM | POA: Diagnosis not present

## 2021-12-02 DIAGNOSIS — F03911 Unspecified dementia, unspecified severity, with agitation: Secondary | ICD-10-CM | POA: Diagnosis not present

## 2021-12-27 DIAGNOSIS — E559 Vitamin D deficiency, unspecified: Secondary | ICD-10-CM | POA: Diagnosis not present

## 2021-12-27 DIAGNOSIS — I498 Other specified cardiac arrhythmias: Secondary | ICD-10-CM | POA: Diagnosis not present

## 2021-12-27 DIAGNOSIS — I10 Essential (primary) hypertension: Secondary | ICD-10-CM | POA: Diagnosis not present

## 2021-12-27 DIAGNOSIS — F03911 Unspecified dementia, unspecified severity, with agitation: Secondary | ICD-10-CM | POA: Diagnosis not present

## 2022-01-13 ENCOUNTER — Encounter: Payer: Self-pay | Admitting: Cardiovascular Disease

## 2022-01-13 ENCOUNTER — Ambulatory Visit: Payer: PPO | Admitting: Cardiovascular Disease

## 2022-01-13 DIAGNOSIS — E782 Mixed hyperlipidemia: Secondary | ICD-10-CM | POA: Diagnosis not present

## 2022-01-13 DIAGNOSIS — I493 Ventricular premature depolarization: Secondary | ICD-10-CM | POA: Diagnosis not present

## 2022-01-13 DIAGNOSIS — I10 Essential (primary) hypertension: Secondary | ICD-10-CM | POA: Diagnosis not present

## 2022-01-13 DIAGNOSIS — I82512 Chronic embolism and thrombosis of left femoral vein: Secondary | ICD-10-CM

## 2022-01-13 NOTE — Assessment & Plan Note (Signed)
History of essential hypertension blood pressure measured today at 110/50.  She is on Diovan and hydrochlorothiazide.

## 2022-01-13 NOTE — Assessment & Plan Note (Signed)
History of left lower extremity DVT now having been on Xarelto for 6 months.  She is completely asymptomatic.  Her venous Doppler studies performed/12/23 showed complete resolution.  She can stop her Xarelto.

## 2022-01-13 NOTE — Patient Instructions (Signed)
Medication Instructions:   -you may stop taking xarelto (rivarolxaban)  *If you need a refill on your cardiac medications before your next appointment, please call your pharmacy*   Follow-Up: At [facility], you and your health needs are our priority.  As part of our continuing mission to provide you with exceptional heart care, we have created designated Provider Care Teams.  These Care Teams include your primary Cardiologist (physician) and Advanced Practice Providers (APPs -  Physician Assistants and Nurse Practitioners) who all work together to provide you with the care you need, when you need it.  We recommend signing up for the patient portal called "MyChart".  Sign up information is provided on this After Visit Summary.  MyChart is used to connect with patients for Virtual Visits (Telemedicine).  Patients are able to view lab/test results, encounter notes, upcoming appointments, etc.  Non-urgent messages can be sent to your provider as well.   To learn more about what you can do with MyChart, go to [email or web address].    Your next appointment:   12 month(s)  The format for your next appointment:   In Person  Provider:   Quay Burow, MD

## 2022-01-13 NOTE — Assessment & Plan Note (Signed)
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  She is currently asymptomatic.

## 2022-01-13 NOTE — Progress Notes (Signed)
01/13/2022 Maria Gilmore   1940/12/30  [id number]  Primary Physician Jaclynn Major, NP Primary Cardiologist: Lorretta Harp MD [name], [place]  HPI:  Maria Gilmore is a 81 y.o.  thin-appearing widowed Caucasian female, mother of 2 children, grandmother of 5 grandchildren who I initially saw at the request of Dr. Vernice Jefferson 06/26/11. I last saw her in the office 09/07/2021.  She is accompanied by one of her daughter Maria Gilmore who is a Art therapist..  She lives alone but not far from her daughter Maria Gilmore.    She has a history of hypertension and polycystic ovarian disease on metformin. She had a Myoview stress test performed 08/26/12 which is entirely normal.    Since I saw her a year ago she did see [name] in the office September 2022 for palpitations.  Event monitor showed frequent PVCs, and 2D echo was essentially normal.  She was begun on beta-blocker which resulted in bradycardia and this was separately discontinued.  She then saw Dr. Harriet Masson  in the office on 06/22/2021 for left lower extremity swelling.  Venous Doppler showed DVT.  She was begun on Xarelto and this has resolved.  Since I saw her back in the office 3 months ago she continues to do well.  Her venous Doppler studies performed/12/23 showed complete resolution of her left lower extremity DVT.  Her coronary calcium score performed/10/23 was 16.  She denies chest pain, shortness of breath or palpitations.  She does however have progressive dementia.   Current Meds  Medication Sig   cephALEXin (KEFLEX) 500 MG capsule Take 500 mg by mouth See admin instructions. Bid x 5 days   cholecalciferol (VITAMIN D3) 25 MCG (1000 UNIT) tablet Take 1,000 Units by mouth daily.   clonazePAM (KLONOPIN) 0.5 MG tablet Take 0.25 mg by mouth 2 (two) times daily.   donepezil (ARICEPT) 10 MG tablet Take 10 mg by mouth at bedtime.   hydrochlorothiazide (HYDRODIURIL) 25 MG tablet Take 25 mg by mouth daily.    Polyvinyl  Alcohol-Povidone (REFRESH OP) Place 1 drop into both eyes daily.   QUEtiapine (SEROQUEL) 25 MG tablet Take 25 mg by mouth at bedtime.   valsartan (DIOVAN) 80 MG tablet TAKE 1.5 TABLETS (120 MG TOTAL) BY MOUTH AT BEDTIME. *NEW DOSE (Patient taking differently: Take 80 mg by mouth daily.)   vitamin E 100 UNIT capsule Take 100 Units by mouth daily.   [DISCONTINUED] rivaroxaban (XARELTO) 20 MG TABS tablet Take 1 tablet (20 mg total) by mouth daily with supper. (Patient taking differently: Take 20 mg by mouth at bedtime.)     Allergies  Allergen Reactions   Kenalog [Triamcinolone] Rash   Sulfa Antibiotics Rash    Social History   Socioeconomic History   Marital status: Married    Spouse name: Not on file   Number of children: 2   Years of education: 12   Highest education level: Not on file  Occupational History   Not on file  Tobacco Use   Smoking status: Never   Smokeless tobacco: Never  Substance and Sexual Activity   Alcohol use: No   Drug use: No   Sexual activity: Not on file  Other Topics Concern   Not on file  Social History Narrative   01/05/21 lives alone, dgtr < 1 mile away   Coffee, several c daily   Social Determinants of Health   Financial Resource Strain: Not on file  Food Insecurity: Not on file  Transportation Needs:  Not on file  Physical Activity: Not on file  Stress: Not on file  Social Connections: Not on file  Intimate Partner Violence: Not on file     Review of Systems: General: negative for chills, fever, night sweats or weight changes.  Cardiovascular: negative for chest pain, dyspnea on exertion, edema, orthopnea, palpitations, paroxysmal nocturnal dyspnea or shortness of breath Dermatological: negative for rash Respiratory: negative for cough or wheezing Urologic: negative for hematuria Abdominal: negative for nausea, vomiting, diarrhea, bright red blood per rectum, melena, or hematemesis Neurologic: negative for visual changes, syncope, or  dizziness All other systems reviewed and are otherwise negative except as noted above.    Blood pressure (!) 110/50, pulse 69, height '5\' 1"'$  (1.549 m), weight 149 lb (67.6 kg).  General appearance: alert and no distress Neck: no adenopathy, no carotid bruit, no JVD, supple, symmetrical, trachea midline, and thyroid not enlarged, symmetric, no tenderness/mass/nodules Lungs: clear to auscultation bilaterally Heart: regular rate and rhythm, S1, S2 normal, no murmur, click, rub or gallop Extremities: extremities normal, atraumatic, no cyanosis or edema Pulses: 2+ and symmetric Skin: Skin color, texture, turgor normal. No rashes or lesions Neurologic: Grossly normal  EKG not performed today  ASSESSMENT AND PLAN:   HTN (hypertension) History of essential hypertension blood pressure measured today at 110/50.  She is on Diovan and hydrochlorothiazide.  Hyperlipidemia History of hyperlipidemia not on statin therapy with lipid profile performed 03/11/2021 revealing total cholesterol 210, LDL 140 and HDL 49.  Given her age, progressive dementia and her relatively low coronary calcium score of 16.  I do not feel compelled to begin her on a statin drug.  Deep vein thrombosis (DVT) of left lower extremity (HCC) History of left lower extremity DVT now having been on Xarelto for 6 months.  She is completely asymptomatic.  Her venous Doppler studies performed/12/23 showed complete resolution.  She can stop her Xarelto.  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  She is currently asymptomatic.     Lorretta Harp MD FACP,FACC,FAHA, [facility] 01/13/2022 1:44 PM

## 2022-01-13 NOTE — Assessment & Plan Note (Signed)
History of hyperlipidemia not on statin therapy with lipid profile performed 03/11/2021 revealing total cholesterol 210, LDL 140 and HDL 49.  Given her age, progressive dementia and her relatively low coronary calcium score of 16.  I do not feel compelled to begin her on a statin drug.

## 2022-02-09 DIAGNOSIS — Z86718 Personal history of other venous thrombosis and embolism: Secondary | ICD-10-CM | POA: Diagnosis not present

## 2022-02-09 DIAGNOSIS — R32 Unspecified urinary incontinence: Secondary | ICD-10-CM | POA: Diagnosis not present

## 2022-02-09 DIAGNOSIS — E663 Overweight: Secondary | ICD-10-CM | POA: Diagnosis not present

## 2022-02-09 DIAGNOSIS — Z9849 Cataract extraction status, unspecified eye: Secondary | ICD-10-CM | POA: Diagnosis not present

## 2022-02-09 DIAGNOSIS — M8580 Other specified disorders of bone density and structure, unspecified site: Secondary | ICD-10-CM | POA: Diagnosis not present

## 2022-02-09 DIAGNOSIS — E559 Vitamin D deficiency, unspecified: Secondary | ICD-10-CM | POA: Diagnosis not present

## 2022-02-09 DIAGNOSIS — I10 Essential (primary) hypertension: Secondary | ICD-10-CM | POA: Diagnosis not present

## 2022-02-09 DIAGNOSIS — E782 Mixed hyperlipidemia: Secondary | ICD-10-CM | POA: Diagnosis not present

## 2022-02-09 DIAGNOSIS — I471 Supraventricular tachycardia: Secondary | ICD-10-CM | POA: Diagnosis not present

## 2022-02-09 DIAGNOSIS — Z8673 Personal history of transient ischemic attack (TIA), and cerebral infarction without residual deficits: Secondary | ICD-10-CM | POA: Diagnosis not present

## 2022-03-11 ENCOUNTER — Emergency Department (HOSPITAL_COMMUNITY)
Admission: EM | Admit: 2022-03-11 | Discharge: 2022-03-12 | Disposition: A | Discharge status: Home / Self Care | Payer: PPO | Attending: Emergency Medicine | Admitting: Emergency Medicine

## 2022-03-11 ENCOUNTER — Telehealth: Payer: Self-pay | Admitting: Cardiology

## 2022-03-11 ENCOUNTER — Other Ambulatory Visit: Payer: Self-pay

## 2022-03-11 DIAGNOSIS — M7989 Other specified soft tissue disorders: Secondary | ICD-10-CM | POA: Diagnosis not present

## 2022-03-11 DIAGNOSIS — I10 Essential (primary) hypertension: Secondary | ICD-10-CM | POA: Insufficient documentation

## 2022-03-11 DIAGNOSIS — Z79899 Other long term (current) drug therapy: Secondary | ICD-10-CM | POA: Diagnosis not present

## 2022-03-11 DIAGNOSIS — R2240 Localized swelling, mass and lump, unspecified lower limb: Secondary | ICD-10-CM | POA: Diagnosis not present

## 2022-03-11 DIAGNOSIS — I82512 Chronic embolism and thrombosis of left femoral vein: Secondary | ICD-10-CM | POA: Insufficient documentation

## 2022-03-11 DIAGNOSIS — Z86718 Personal history of other venous thrombosis and embolism: Secondary | ICD-10-CM | POA: Diagnosis not present

## 2022-03-11 DIAGNOSIS — R600 Localized edema: Secondary | ICD-10-CM | POA: Insufficient documentation

## 2022-03-11 LAB — BASIC METABOLIC PANEL
Anion gap: 9 (ref 5–15)
BUN: 23 mg/dL (ref 8–23)
CO2: 27 mmol/L (ref 22–32)
Calcium: 9.3 mg/dL (ref 8.9–10.3)
Chloride: 104 mmol/L (ref 98–111)
Creatinine, Ser: 0.97 mg/dL (ref 0.44–1.00)
GFR, Estimated: 60 mL/min — ABNORMAL LOW (ref >60)
Glucose, Bld: 110 mg/dL — ABNORMAL HIGH (ref 70–99)
Potassium: 3.3 mmol/L — ABNORMAL LOW (ref 3.5–5.1)
Sodium: 140 mmol/L (ref 135–145)

## 2022-03-11 LAB — CBC
HCT: 38.8 % (ref 36.0–46.0)
Hemoglobin: 12.8 g/dL (ref 12.0–15.0)
MCH: 29.7 pg (ref 26.0–34.0)
MCHC: 33 g/dL (ref 30.0–36.0)
MCV: 90 fL (ref 80.0–100.0)
Platelets: 260 10*3/uL (ref 150–400)
RBC: 4.31 MIL/uL (ref 3.87–5.11)
RDW: 13 % (ref 11.5–15.5)
WBC: 7.5 10*3/uL (ref 4.0–10.5)
nRBC: 0 % (ref 0.0–0.2)

## 2022-03-11 NOTE — ED Triage Notes (Signed)
Pt reported to ED with c/o swelling and "red area" to right leg. Pt has hx of blood clots and recently stopped taking Xarelto in December.

## 2022-03-11 NOTE — Telephone Encounter (Signed)
Daughter calling that the patient might have a left leg acute DVT as her leg is swollen, red and edematous compared to the right leg. - patient has dementia so was not complaining. Patient had DVT and completed xarelto in July   - I have recommended the daughter to bring her mom (patient) to the ER to get [place] leg and see if we can confirm the DVT. She'll probably need life long xarelto if she has a recurrent DVT. - the patient will come to [facility] ER

## 2022-03-12 ENCOUNTER — Ambulatory Visit (HOSPITAL_BASED_OUTPATIENT_CLINIC_OR_DEPARTMENT_OTHER)
Admission: RE | Admit: 2022-03-12 | Discharge: 2022-03-12 | Disposition: A | Discharge status: Home / Self Care | Payer: PPO | Source: Ambulatory Visit | Attending: Emergency Medicine | Admitting: Emergency Medicine

## 2022-03-12 DIAGNOSIS — I82512 Chronic embolism and thrombosis of left femoral vein: Secondary | ICD-10-CM | POA: Insufficient documentation

## 2022-03-12 DIAGNOSIS — M7989 Other specified soft tissue disorders: Secondary | ICD-10-CM

## 2022-03-12 DIAGNOSIS — Z86718 Personal history of other venous thrombosis and embolism: Secondary | ICD-10-CM | POA: Insufficient documentation

## 2022-03-12 MED ORDER — ENOXAPARIN SODIUM 80 MG/0.8ML IJ SOSY
70.0000 mg | PREFILLED_SYRINGE | Freq: Once | INTRAMUSCULAR | Status: AC
Start: 2022-03-12 — End: 2022-03-12
  Administered 2022-03-12: 70 mg via SUBCUTANEOUS
  Filled 2022-03-12: qty 0.7

## 2022-03-12 NOTE — Progress Notes (Signed)
VASCULAR LAB    Left lower extremity venous duplex has been performed.  See CV proc for preliminary results.   Asa Fath, RVT 03/12/2022, 11:42 AM

## 2022-03-12 NOTE — ED Provider Notes (Signed)
[facility] EMERGENCY DEPARTMENT Provider Note   CSN: [id number] Arrival date & time: 03/11/22  2048     History  Chief Complaint  Patient presents with   Leg Swelling    Maria Gilmore is a 81 y.o. female.  The history is provided by the patient and a relative.   Patient with history of hypertension and previous DVT presents with left leg swelling.  No known trauma, but over the past 2 days she has had increasing swelling and pain in the leg.  No fevers or chills.  No chest pain or shortness of breath She is no longer on anticoagulation    Home Medications Prior to Admission medications   Medication Sig Start Date End Date Taking? Authorizing Provider  cholecalciferol (VITAMIN D3) 25 MCG (1000 UNIT) tablet Take 1,000 Units by mouth daily.    [provider]  clonazePAM (KLONOPIN) 0.5 MG tablet Take 0.25 mg by mouth 2 (two) times daily. 12/21/20   [provider]  donepezil (ARICEPT) 10 MG tablet Take 10 mg by mouth at bedtime. 07/28/20   [provider]  hydrochlorothiazide (HYDRODIURIL) 25 MG tablet Take 25 mg by mouth daily.     [provider]  Polyvinyl Alcohol-Povidone (REFRESH OP) Place 1 drop into both eyes daily.    [provider]  QUEtiapine (SEROQUEL) 25 MG tablet Take 25 mg by mouth at bedtime. 11/29/20   [provider]  valsartan (DIOVAN) 80 MG tablet TAKE 1.5 TABLETS (120 MG TOTAL) BY MOUTH AT BEDTIME. *NEW DOSE Patient taking differently: Take 80 mg by mouth daily. 05/02/21   Lorretta Harp, MD  vitamin E 100 UNIT capsule Take 100 Units by mouth daily.    [provider]      Allergies    Kenalog [triamcinolone] and Sulfa antibiotics    Review of Systems   Review of Systems  Constitutional:  Negative for fever.  Respiratory:  Negative for shortness of breath.   Cardiovascular:  Positive for leg swelling. Negative for chest pain.  Musculoskeletal:  Positive for myalgias.     Physical Exam Updated Vital Signs BP 133/79 (BP Location: Right Arm)   Pulse (!) 56   Temp 98.1 F (36.7 C) (Oral)   Resp 16   Ht 1.549 m ('5\' 1"'$ )   Wt 67.1 kg   SpO2 100%   BMI 27.96 kg/m  Physical Exam CONSTITUTIONAL: Elderly, no acute distress HEAD: Normocephalic/atraumatic ENMT: Mucous membranes moist NECK: supple no meningeal signs CV: S1/S2 noted LUNGS: Lungs are clear to auscultation bilaterally, no apparent distress ABDOMEN: soft NEURO: Pt is awake/alert/appropriate, moves all extremitiesx4.  No facial droop.   EXTREMITIES: pulses normal/equal, full ROM 1+ pitting edema to left lower extremity with diffuse left calf tenderness.  Minimal erythema is noted to left calf.  Distal pulses equal and intact. Asymmetric edema of the left lower extremity No crepitus.  No significant findings for abscess or cellulitis No deformities, full range of motion of left knee and left ankle SKIN: warm, color normal  ED Results / Procedures / Treatments   Labs (all labs ordered are listed, but only abnormal results are displayed) Labs Reviewed  BASIC METABOLIC PANEL - Abnormal; Notable for the following components:      Result Value   Potassium 3.3 (*)    Glucose, Bld 110 (*)    GFR, Estimated 60 (*)    All other components within normal limits  CBC    EKG None  Radiology No results  found.  Procedures Procedures    Medications Ordered in ED Medications  enoxaparin (LOVENOX) 100 mg/mL injection 1 mg/kg (has no administration in time range)    ED Course/ Medical Decision Making/ A&P                           Medical Decision Making Amount and/or Complexity of Data Reviewed Labs: ordered.  Risk Prescription drug management.   This patient presents to the ED for concern of leg swelling, this involves an extensive number of treatment options, and is a complaint that carries with it a high risk of complications and morbidity.  The differential diagnosis includes  but is not limited to DVT, cellulitis, CHF, anasarca  Comorbidities that complicate the patient evaluation: Patient's presentation is complicated by their history of dementia, previous DVT  Social Determinants of Health: Patient's  memory loss history of sundowning   increases the complexity of managing their presentation  Additional history obtained: Additional history obtained from family Records reviewed  cardiology records review reveals patient had DVT in December 2022 and responded to Xarelto.  Last DVT study from April 2023 negative DVT  Lab Tests: I Ordered, and personally interpreted labs.  The pertinent results include: Labs unremarkable   Medicines ordered and prescription drug management: I ordered medication including Lovenox for presumed DVT  Complexity of problems addressed: Patient's presentation is most consistent with  acute presentation with potential threat to life or bodily function  Disposition: After consideration of the diagnostic results and the patient's response to treatment,  I feel that the patent would benefit from discharge   .    Patient with likely recurrent DVT. We will give Lovenox, next day  ultrasound has been ordered.  Patient will be discharged in the care of her daughter       Final Clinical Impression(s) / ED Diagnoses Final diagnoses:  Leg swelling    Rx / DC Orders ED Discharge Orders          Ordered    LE VENOUS        03/12/22 0259              Ripley Fraise, MD 03/12/22 [phone]

## 2022-03-21 DIAGNOSIS — R8290 Unspecified abnormal findings in urine: Secondary | ICD-10-CM | POA: Diagnosis not present

## 2022-04-04 DIAGNOSIS — Z23 Encounter for immunization: Secondary | ICD-10-CM | POA: Diagnosis not present

## 2022-04-04 DIAGNOSIS — Z0000 Encounter for general adult medical examination without abnormal findings: Secondary | ICD-10-CM | POA: Diagnosis not present

## 2022-04-04 DIAGNOSIS — M8580 Other specified disorders of bone density and structure, unspecified site: Secondary | ICD-10-CM | POA: Diagnosis not present

## 2022-04-04 DIAGNOSIS — I700 Atherosclerosis of aorta: Secondary | ICD-10-CM | POA: Diagnosis not present

## 2022-04-04 DIAGNOSIS — I10 Essential (primary) hypertension: Secondary | ICD-10-CM | POA: Diagnosis not present

## 2022-04-04 DIAGNOSIS — Z6827 Body mass index (BMI) 27.0-27.9, adult: Secondary | ICD-10-CM | POA: Diagnosis not present

## 2022-04-04 DIAGNOSIS — Z86718 Personal history of other venous thrombosis and embolism: Secondary | ICD-10-CM | POA: Diagnosis not present

## 2022-04-04 DIAGNOSIS — Z1331 Encounter for screening for depression: Secondary | ICD-10-CM | POA: Diagnosis not present

## 2022-04-04 DIAGNOSIS — R7303 Prediabetes: Secondary | ICD-10-CM | POA: Diagnosis not present

## 2022-04-04 DIAGNOSIS — E538 Deficiency of other specified B group vitamins: Secondary | ICD-10-CM | POA: Diagnosis not present

## 2022-04-17 IMAGING — DX DG CHEST 1V
1 series · 1 of 1 positions shown · non-contrast
Comparison: 08/05/2012

CLINICAL DATA: Bigeminy

EXAM:
CHEST  1 VIEW

[chest pa]
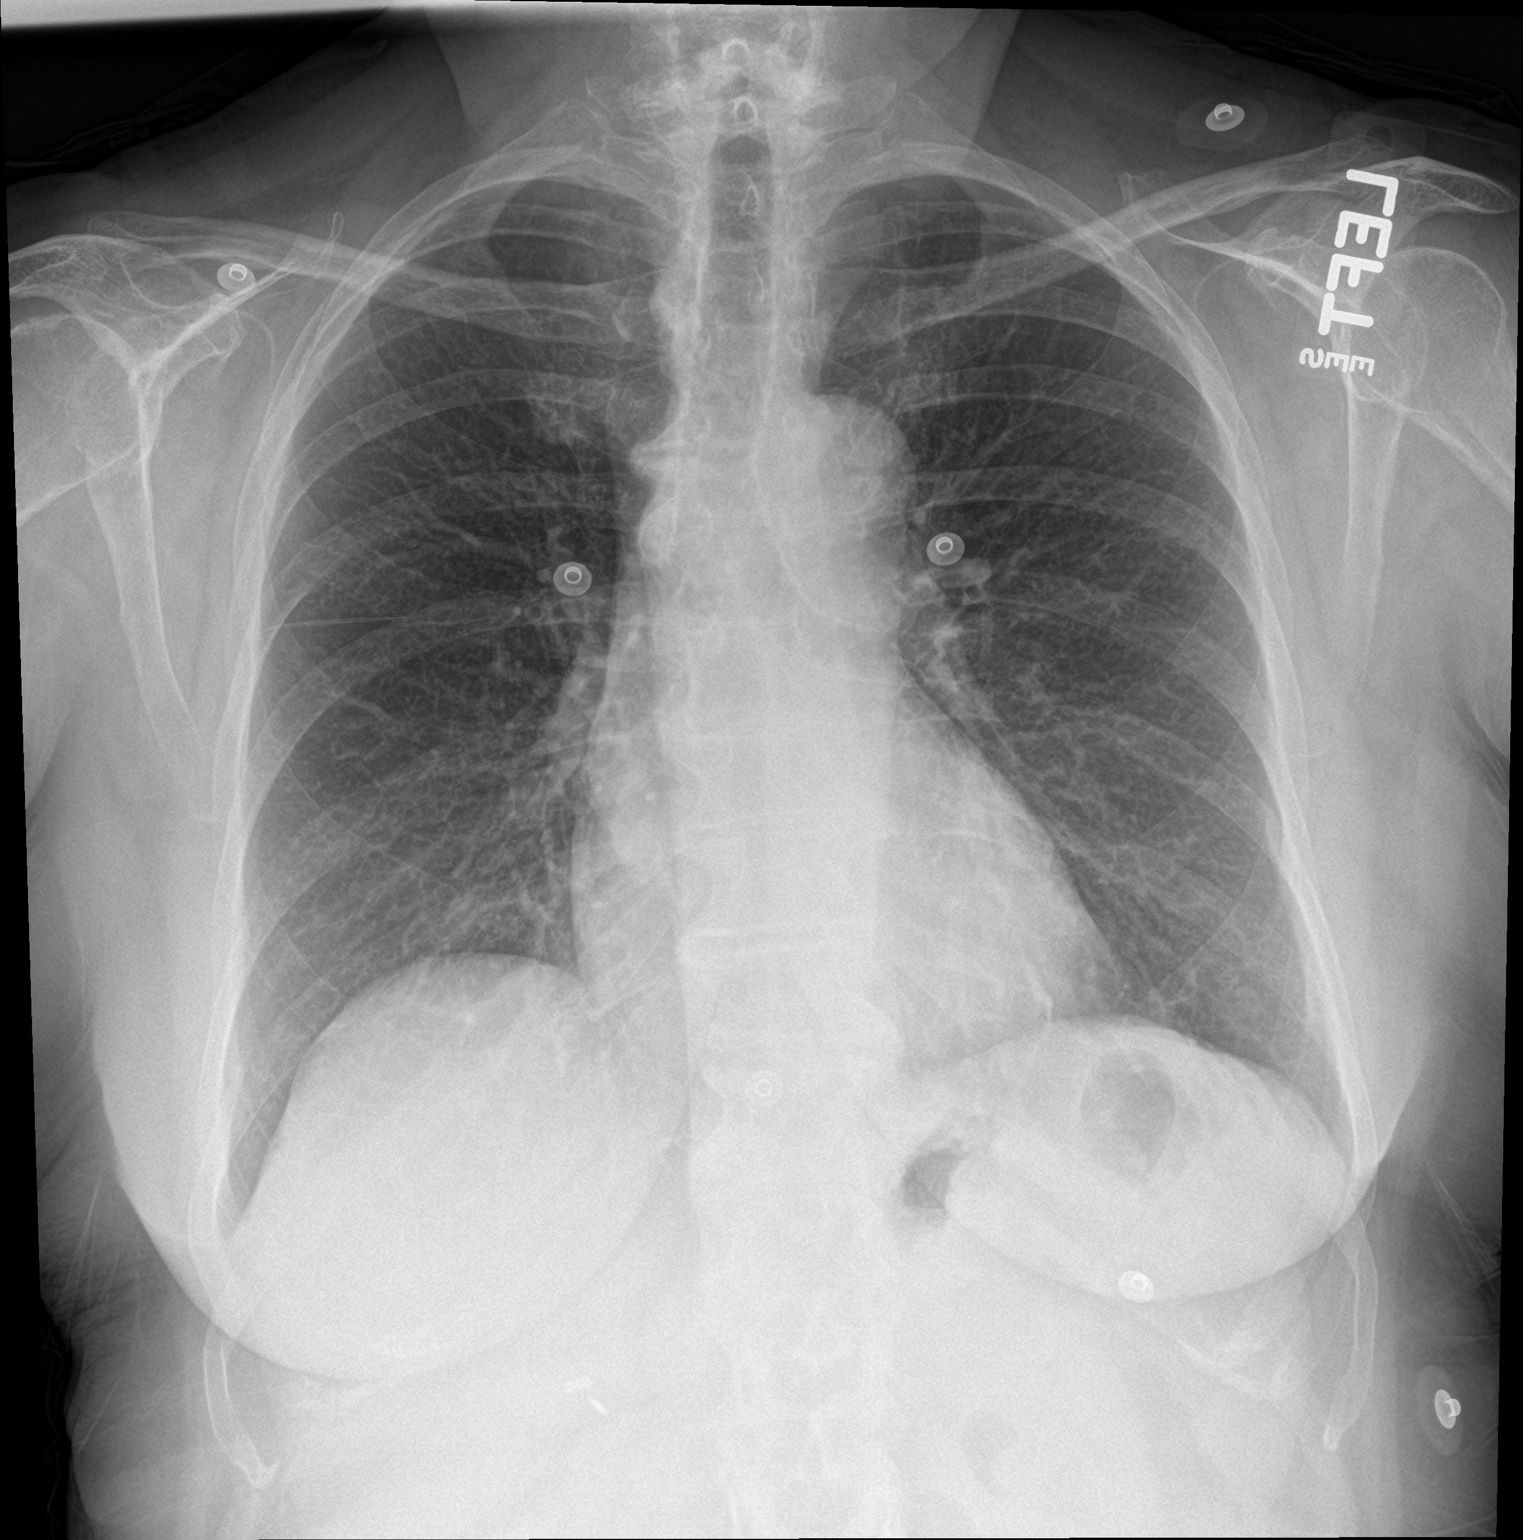

[1 of 1 positions shown; findings below may reference images not displayed]

FINDINGS: The heart size and mediastinal contours are within normal limits.
Both lungs are clear. The visualized skeletal structures are
unremarkable.
IMPRESSION: No active disease.

## 2022-05-09 DIAGNOSIS — R8290 Unspecified abnormal findings in urine: Secondary | ICD-10-CM | POA: Diagnosis not present

## 2022-05-12 DIAGNOSIS — S1093XA Contusion of unspecified part of neck, initial encounter: Secondary | ICD-10-CM | POA: Diagnosis not present

## 2022-05-22 ENCOUNTER — Other Ambulatory Visit: Payer: Self-pay | Admitting: Student

## 2022-05-22 DIAGNOSIS — R5383 Other fatigue: Secondary | ICD-10-CM | POA: Diagnosis not present

## 2022-05-22 DIAGNOSIS — R4182 Altered mental status, unspecified: Secondary | ICD-10-CM | POA: Diagnosis not present

## 2022-05-22 DIAGNOSIS — R410 Disorientation, unspecified: Secondary | ICD-10-CM | POA: Diagnosis not present

## 2022-05-23 LAB — CBC WITH DIFFERENTIAL/PLATELET
Basophils Absolute: 0.1 10*3/uL (ref 0.0–0.2)
Basos: 1 %
EOS (ABSOLUTE): 0.2 10*3/uL (ref 0.0–0.4)
Eos: 3 %
Hematocrit: 38.9 % (ref 34.0–46.6)
Hemoglobin: 12.9 g/dL (ref 11.1–15.9)
Immature Grans (Abs): 0 10*3/uL (ref 0.0–0.1)
Immature Granulocytes: 0 %
Lymphocytes Absolute: 1.8 10*3/uL (ref 0.7–3.1)
Lymphs: 20 %
MCH: 29 pg (ref 26.6–33.0)
MCHC: 33.2 g/dL (ref 31.5–35.7)
MCV: 87 fL (ref 79–97)
Monocytes Absolute: 0.7 10*3/uL (ref 0.1–0.9)
Monocytes: 8 %
Neutrophils Absolute: 6.3 10*3/uL (ref 1.4–7.0)
Neutrophils: 68 %
Platelets: 329 10*3/uL (ref 150–450)
RBC: 4.45 x10E6/uL (ref 3.77–5.28)
RDW: 12.4 % (ref 11.7–15.4)
WBC: 9.1 10*3/uL (ref 3.4–10.8)

## 2022-05-23 LAB — BASIC METABOLIC PANEL
BUN/Creatinine Ratio: 33 — ABNORMAL HIGH (ref 12–28)
BUN: 37 mg/dL — ABNORMAL HIGH (ref 8–27)
CO2: 26 mmol/L (ref 20–29)
Calcium: 9.8 mg/dL (ref 8.7–10.3)
Chloride: 98 mmol/L (ref 96–106)
Creatinine, Ser: 1.13 mg/dL — ABNORMAL HIGH (ref 0.57–1.00)
Glucose: 91 mg/dL (ref 70–99)
Potassium: 4.1 mmol/L (ref 3.5–5.2)
Sodium: 142 mmol/L (ref 134–144)
eGFR: 50 mL/min/{1.73_m2} — ABNORMAL LOW (ref >59)

## 2022-05-24 DIAGNOSIS — M542 Cervicalgia: Secondary | ICD-10-CM | POA: Diagnosis not present

## 2022-05-24 DIAGNOSIS — M479 Spondylosis, unspecified: Secondary | ICD-10-CM | POA: Diagnosis not present

## 2022-05-26 ENCOUNTER — Encounter: Payer: Self-pay | Admitting: Cardiovascular Disease

## 2022-05-26 ENCOUNTER — Ambulatory Visit: Payer: PPO | Attending: Cardiovascular Disease | Admitting: Cardiovascular Disease

## 2022-05-26 VITALS — BP 130/72 | HR 68 | Ht 63.0 in | Wt 142.4 lb

## 2022-05-26 DIAGNOSIS — E782 Mixed hyperlipidemia: Secondary | ICD-10-CM

## 2022-05-26 DIAGNOSIS — I82512 Chronic embolism and thrombosis of left femoral vein: Secondary | ICD-10-CM | POA: Diagnosis not present

## 2022-05-26 DIAGNOSIS — I10 Essential (primary) hypertension: Secondary | ICD-10-CM | POA: Diagnosis not present

## 2022-05-26 DIAGNOSIS — I493 Ventricular premature depolarization: Secondary | ICD-10-CM | POA: Diagnosis not present

## 2022-05-26 NOTE — Patient Instructions (Signed)
Medication Instructions:  Your physician recommends that you continue on your current medications as directed. Please refer to the Current Medication list given to you today.  *If you need a refill on your cardiac medications before your next appointment, please call your pharmacy*   Follow-Up: At [facility], you and your health needs are our priority.  As part of our continuing mission to provide you with exceptional heart care, we have created designated Provider Care Teams.  These Care Teams include your primary Cardiologist (physician) and Advanced Practice Providers (APPs -  Physician Assistants and Nurse Practitioners) who all work together to provide you with the care you need, when you need it.  We recommend signing up for the patient portal called "MyChart".  Sign up information is provided on this After Visit Summary.  MyChart is used to connect with patients for Virtual Visits (Telemedicine).  Patients are able to view lab/test results, encounter notes, upcoming appointments, etc.  Non-urgent messages can be sent to your provider as well.   To learn more about what you can do with MyChart, go to [email or web address].    Your next appointment:   6 month(s)  The format for your next appointment:   In Person  Provider:   Angela Duke, PA-C, Callie Goodrich, PA-C, Kathryn Lawrence, DNP, ANP, Hao Meng, PA-C, or Emily Monge, NP       Then, Jonathan Berry, MD will plan to see you again in 12 month(s).   

## 2022-05-26 NOTE — Progress Notes (Signed)
05/26/2022 Maria Gilmore   28-Apr-1941  [id number]  Primary Physician Jaclynn Major, NP Primary Cardiologist: Lorretta Harp MD Maria Gilmore, [place]  HPI:  Maria Gilmore is a 81 y.o.  thin-appearing widowed Caucasian female, mother of 2 children, grandmother of 5 grandchildren who I initially saw at the request of Dr. Vernice Jefferson 01/13/2022. I last saw her in the office 09/07/2021.  She is accompanied by one of her daughters Maria Gilmore who is a Marine scientist and lives in [place].  She lives alone but not far from her daughter Maria Gilmore.    She has a history of hypertension and polycystic ovarian disease on metformin. She had a Myoview stress test performed 08/26/12 which is entirely normal.    She saw [name] in the office September 2022 for palpitations.  Event monitor showed frequent PVCs, and 2D echo was essentially normal.  She was begun on beta-blocker which resulted in bradycardia and this was separately discontinued.  She then saw Dr. Harriet Masson  in the office on 06/22/2021 for left lower extremity swelling.  Venous Doppler showed DVT.  She was begun on Xarelto and this has resolved.  Since I saw her back in the office 4 months ago she was seen in the ER 03/12/2022 by Dr. Christy Gentles.  Apparently she has had some swelling in her left leg.  Doppler performed 03/12/2022 showed chronic DVT in the left femoral and popliteal as well as tibial veins.  She was no longer on Xarelto oral anticoagulation.  Her Doppler performed/12/23 did show complete resolution however.  She appears to have progressive dementia.  I did broach the topic of living will and CODE STATUS with her daughter.  She denies chest pain or shortness of breath.   Current Meds  Medication Sig   cholecalciferol (VITAMIN D3) 25 MCG (1000 UNIT) tablet Take 1,000 Units by mouth daily.   clonazePAM (KLONOPIN) 0.5 MG tablet Take 0.25 mg by mouth 2 (two) times daily.   donepezil (ARICEPT) 10 MG tablet Take 10 mg by mouth at bedtime.    hydrochlorothiazide (HYDRODIURIL) 25 MG tablet Take 25 mg by mouth daily.    Polyvinyl Alcohol-Povidone (REFRESH OP) Place 1 drop into both eyes daily.   QUEtiapine (SEROQUEL) 25 MG tablet Take 25 mg by mouth at bedtime.   valsartan (DIOVAN) 80 MG tablet TAKE 1.5 TABLETS (120 MG TOTAL) BY MOUTH AT BEDTIME. *NEW DOSE (Patient taking differently: Take 80 mg by mouth daily.)   vitamin E 100 UNIT capsule Take 100 Units by mouth daily.     Allergies  Allergen Reactions   Kenalog [Triamcinolone] Rash   Sulfa Antibiotics Rash    Social History   Socioeconomic History   Marital status: Married    Spouse name: Not on file   Number of children: 2   Years of education: 12   Highest education level: Not on file  Occupational History   Not on file  Tobacco Use   Smoking status: Never   Smokeless tobacco: Never  Substance and Sexual Activity   Alcohol use: No   Drug use: No   Sexual activity: Not on file  Other Topics Concern   Not on file  Social History Narrative   01/05/21 lives alone, dgtr < 1 mile away   Coffee, several c daily   Social Determinants of Health   Financial Resource Strain: Not on file  Food Insecurity: Not on file  Transportation Needs: Not on file  Physical Activity: Not on file  Stress:  Not on file  Social Connections: Not on file  Intimate Partner Violence: Not on file     Review of Systems: General: negative for chills, fever, night sweats or weight changes.  Cardiovascular: negative for chest pain, dyspnea on exertion, edema, orthopnea, palpitations, paroxysmal nocturnal dyspnea or shortness of breath Dermatological: negative for rash Respiratory: negative for cough or wheezing Urologic: negative for hematuria Abdominal: negative for nausea, vomiting, diarrhea, bright red blood per rectum, melena, or hematemesis Neurologic: negative for visual changes, syncope, or dizziness All other systems reviewed and are otherwise negative except as noted  above.    Blood pressure 130/72, pulse 68, height '5\' 3"'$  (1.6 m), weight 142 lb 6.4 oz (64.6 kg), SpO2 97 %.  General appearance: alert and no distress Neck: no adenopathy, no carotid bruit, no JVD, supple, symmetrical, trachea midline, and thyroid not enlarged, symmetric, no tenderness/mass/nodules Lungs: clear to auscultation bilaterally Heart: regular rate and rhythm, S1, S2 normal, no murmur, click, rub or gallop Extremities: extremities normal, atraumatic, no cyanosis or edema Pulses: 2+ and symmetric Skin: Skin color, texture, turgor normal. No rashes or lesions Neurologic: Grossly normal  EKG sinus rhythm at 68 with frequent PVCs.  She had nonspecific ST and T wave changes.  Personally reviewed this EKG.  ASSESSMENT AND PLAN:   HTN (hypertension) History of essential hypertension with blood pressure measured today at 130/72.  She is on hydrochlorothiazide and valsartan.  Hyperlipidemia History of hyperlipidemia not on statin therapy with lipid profile performed 04/04/2022 revealing total cholesterol 178, LDL 108 and HDL 46.  Deep vein thrombosis (DVT) of left lower extremity (HCC) History of left lower extremity DVT which initially resolved after a course of Xarelto by duplex ultrasound/12/23.  Because of swelling she had an additional ultrasound performed 03/12/2022 that showed chronic DVT in the left leg.  She recently had a mechanical fall.  On exam the leg is only mildly swollen.  I do not think that given her progressive dementia and fall risk that she is a candidate for long-term oral anticoagulation.  Frequent PVCs Frequent PVCs although his asymptomatic from this.  She has been bradycardic in the past on beta-blocker.     Lorretta Harp MD FACP,FACC,FAHA, [facility] 05/26/2022 9:35 AM

## 2022-05-26 NOTE — Assessment & Plan Note (Signed)
History of hyperlipidemia not on statin therapy with lipid profile performed 04/04/2022 revealing total cholesterol 178, LDL 108 and HDL 46.

## 2022-05-26 NOTE — Assessment & Plan Note (Signed)
Frequent PVCs although his asymptomatic from this.  She has been bradycardic in the past on beta-blocker.

## 2022-05-26 NOTE — Assessment & Plan Note (Signed)
History of left lower extremity DVT which initially resolved after a course of Xarelto by duplex ultrasound/12/23.  Because of swelling she had an additional ultrasound performed 03/12/2022 that showed chronic DVT in the left leg.  She recently had a mechanical fall.  On exam the leg is only mildly swollen.  I do not think that given her progressive dementia and fall risk that she is a candidate for long-term oral anticoagulation.

## 2022-05-26 NOTE — Assessment & Plan Note (Signed)
History of essential hypertension with blood pressure measured today at 130/72.  She is on hydrochlorothiazide and valsartan.

## 2022-06-04 IMAGING — CT CT CARDIAC CORONARY ARTERY CALCIUM SCORE
3 series · 14 of 20 positions shown, 16 images · non-contrast
Comparison: No priors.
COMPARISON: No priors.

Addendum:
EXAM:
OVER-READ INTERPRETATION  CT CHEST

The following report is an over-read performed by radiologist Dr.
Lashondra Fenix [REDACTED] on 10/17/2021. This
over-read does not include interpretation of cardiac or coronary
anatomy or pathology. The coronary calcium score interpretation by
the cardiologist is attached.
TECHNIQUE: A gated, non-contrast computed tomography scan of the heart was
performed using 3mm slice thickness. Axial images were analyzed on a
dedicated workstation. Calcium scoring of the coronary arteries was
performed using the Agatston method.

[Series 2: cascseq 2.0 sa36 70% (id) · axial · 0.39mm/px · z∈[-179,-89]mm · 4 of 76 slices shown]
[im 16/76  vessel]
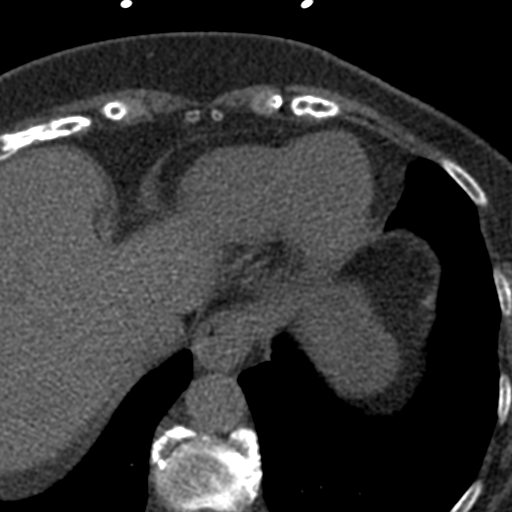
[im 31/76  vessel]
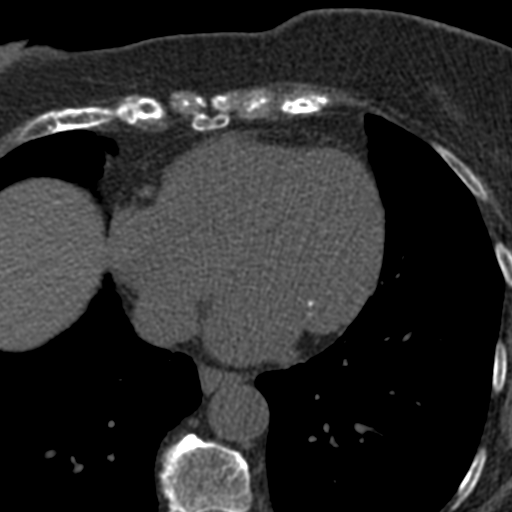
[im 46/76  vessel]
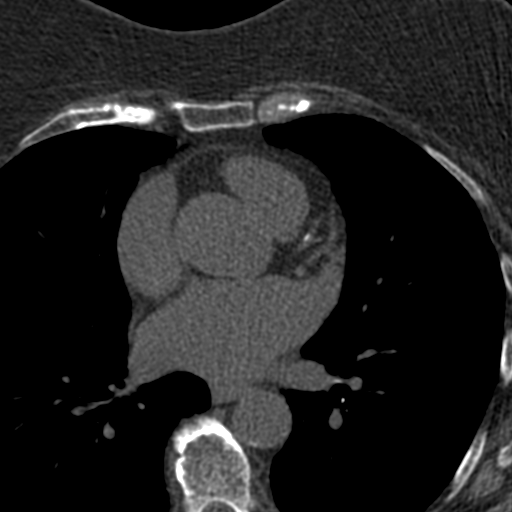
[im 61/76  vessel]
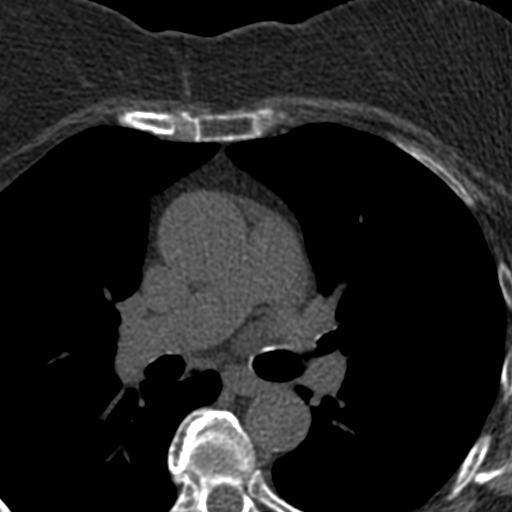

[Series 3: cascseq 2.0 bf37 st · axial · 0.62mm/px · z∈[-185,-85]mm · 5 of 76 slices shown, 7 images]
[im 13/76  vessel]
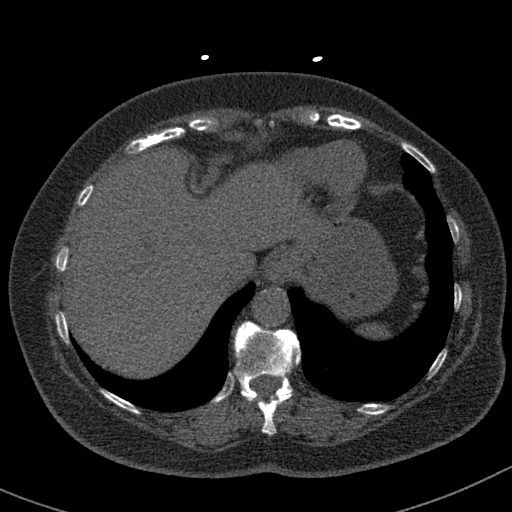
[im 13/76  lung]
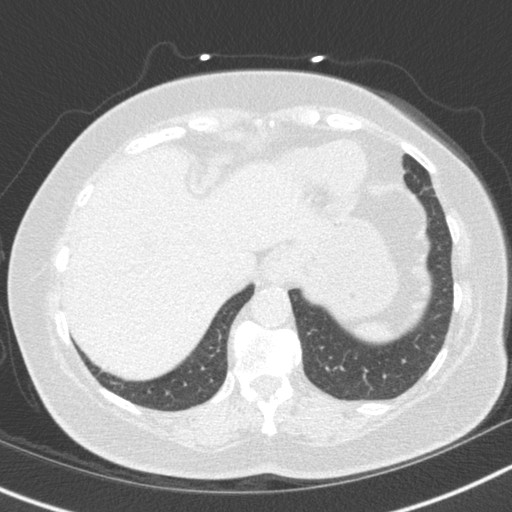
[im 26/76  vessel]
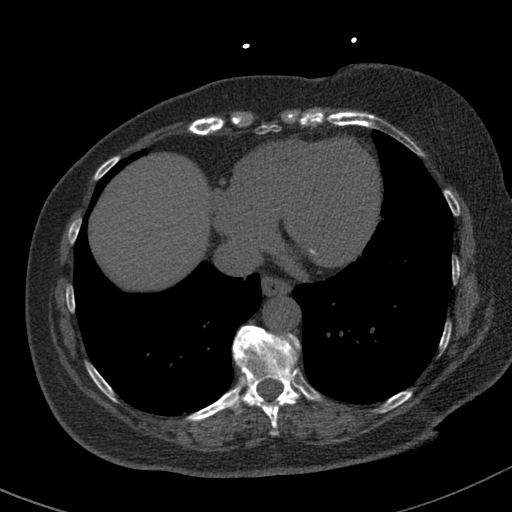
[im 38/76  vessel]
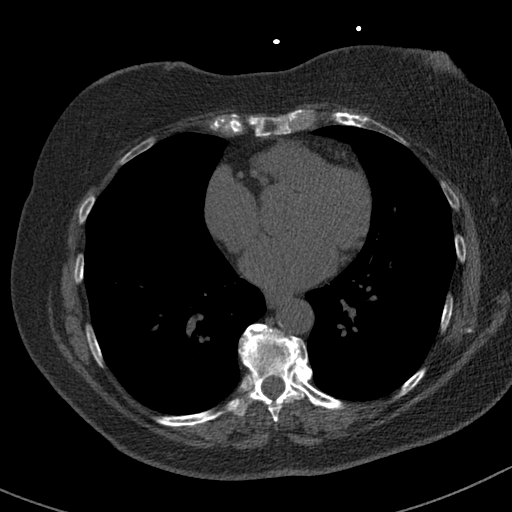
[im 51/76  vessel]
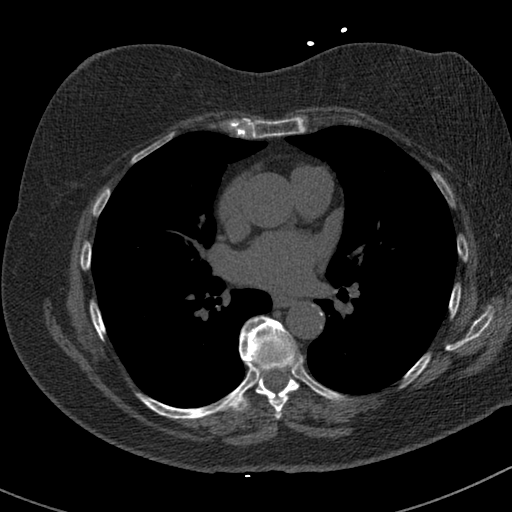
[im 63/76  vessel]
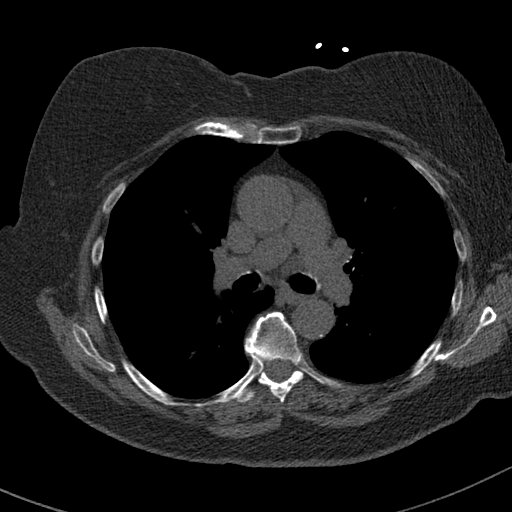
[im 63/76  lung]
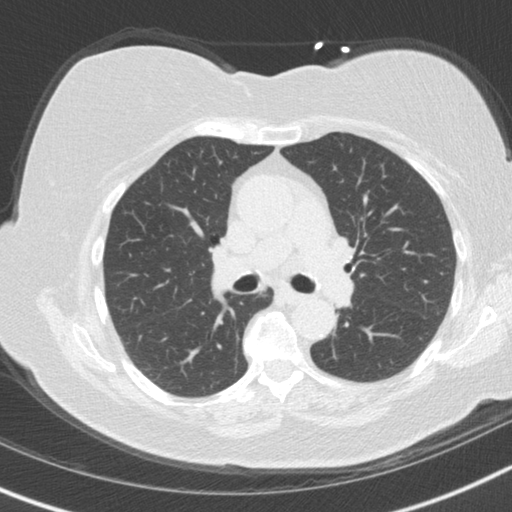

[Series 4: cascseq 2.0 br59 lung · axial · 0.62mm/px · z∈[-185,-85]mm · 5 of 76 slices shown]
[im 13/76  lung]
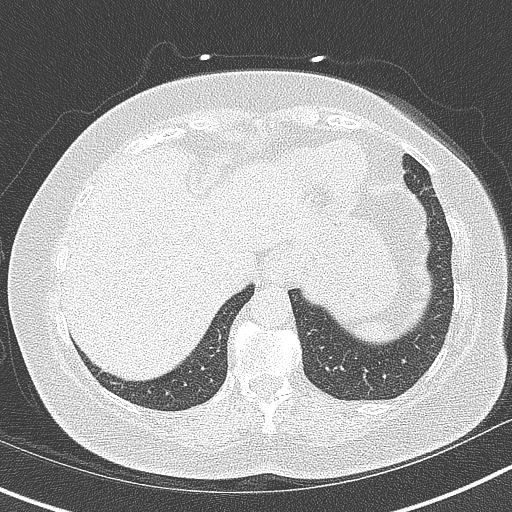
[im 26/76  lung]
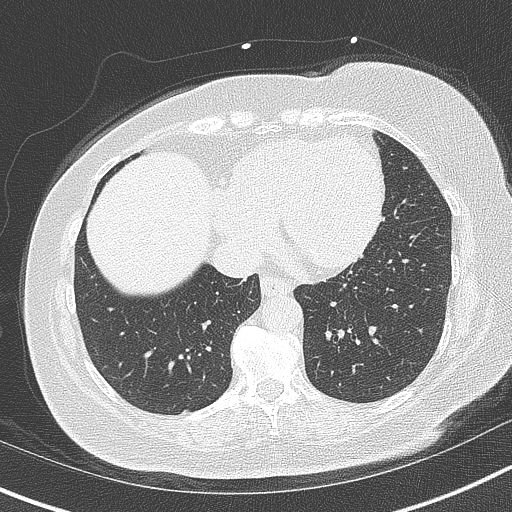
[im 38/76  lung]
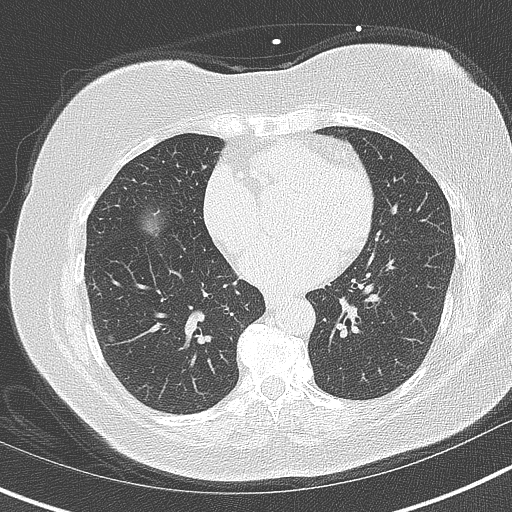
[im 51/76  lung]
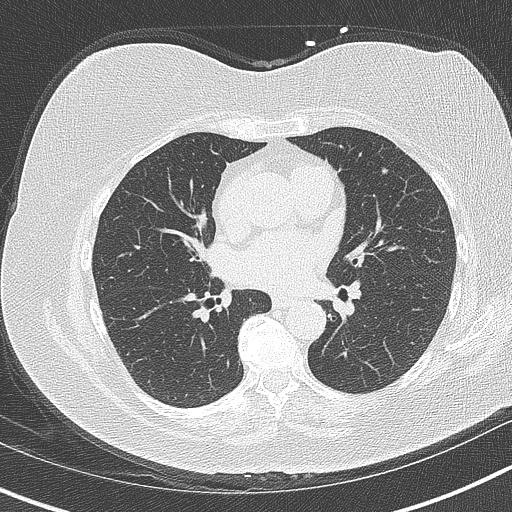
[im 63/76  lung]
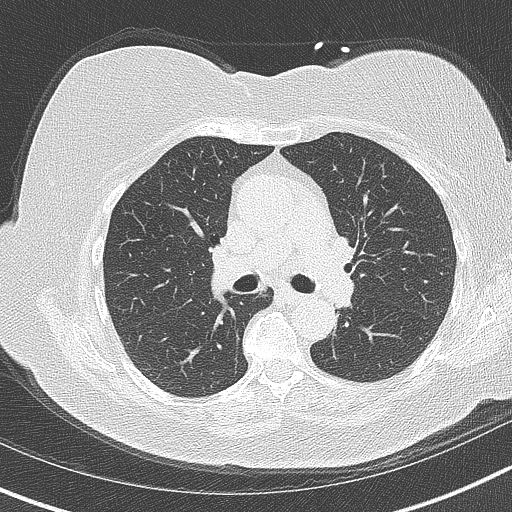

[14 of 20 positions shown; findings below may reference images not displayed]

FINDINGS: Atherosclerotic calcifications in the thoracic aorta. Within the
visualized portions of the thorax there are no suspicious appearing
pulmonary nodules or masses, there is no acute consolidative
airspace disease, no pleural effusions, no pneumothorax and no
lymphadenopathy. Visualized portions of the upper abdomen
demonstrates surgical clips from prior cholecystectomy. 1.6 x 0.8 cm
low-attenuation lesion in segment 2 of the liver, incompletely
characterized on today's non-contrast CT examination, but
statistically likely to represent a cyst. There are no aggressive
appearing lytic or blastic lesions noted in the visualized portions
of the skeleton.
IMPRESSION: 1.  Aortic Atherosclerosis (Q97G3-NP3.3).

ADDENDUM:
Cardiovascular Disease Risk stratification

EXAM:
Coronary Calcium Score
FINDINGS: Coronary arteries: Normal origins.

Coronary Calcium Score:

Left main: 0

Left anterior descending artery:

Left circumflex artery: 0

Right coronary artery:

Total:

Percentile: 28

Pericardium: Normal.

Ascending Aorta: Normal caliber.  Mild aortic root calcification.

Non-cardiac: See separate report from [REDACTED].
IMPRESSION: Coronary calcium score of 16.5. This was 28 percentile for age-,
race-, and sex-matched controls.



If CAC=0, it is reasonable to withhold statin therapy and reassess
in 5 to 10 years, as long as higher risk conditions are absent
(diabetes mellitus, family history of premature CHD in first degree
relatives (males <55 years; females <65 years), cigarette smoking,
or LDL >=190 mg/dL).

If CAC is 1 to 99, it is reasonable to initiate statin therapy for
patients >=55 years of age.

If CAC is >=100 or >=75th percentile, it is reasonable to initiate
statin therapy at any age.

Cardiology referral should be considered for patients with CAC
scores >=400 or >=75th percentile.

*3898 AHA/ACC/AACVPR/AAPA/ABC/SIMONE STEFANIE/JERRY/DATABEX/Damar/MUDASSER/CHEL/KALESHWAR
Guideline on the Management of Blood Cholesterol: A Report of the
American College of Cardiology/American Heart Association Task Force
on Clinical Practice Guidelines. J Am Coll Cardiol.
3947;73(24):6827-6053.

Nosa Tiger, DO [REDACTED]

*** End of Addendum ***
EXAM:
OVER-READ INTERPRETATION  CT CHEST

The following report is an over-read performed by radiologist Dr.
Lashondra Fenix [REDACTED] on 10/17/2021. This
over-read does not include interpretation of cardiac or coronary
anatomy or pathology. The coronary calcium score interpretation by
the cardiologist is attached.
FINDINGS: Atherosclerotic calcifications in the thoracic aorta. Within the
visualized portions of the thorax there are no suspicious appearing
pulmonary nodules or masses, there is no acute consolidative
airspace disease, no pleural effusions, no pneumothorax and no
lymphadenopathy. Visualized portions of the upper abdomen
demonstrates surgical clips from prior cholecystectomy. 1.6 x 0.8 cm
low-attenuation lesion in segment 2 of the liver, incompletely
characterized on today's non-contrast CT examination, but
statistically likely to represent a cyst. There are no aggressive
appearing lytic or blastic lesions noted in the visualized portions
of the skeleton.
IMPRESSION: 1.  Aortic Atherosclerosis (Q97G3-NP3.3).

## 2022-06-30 ENCOUNTER — Encounter: Payer: Self-pay | Admitting: Internal Medicine

## 2022-06-30 ENCOUNTER — Ambulatory Visit: Payer: PPO | Admitting: Internal Medicine

## 2022-06-30 VITALS — BP 114/74 | HR 79 | Temp 97.3°F | Resp 18 | Ht 62.0 in | Wt 140.0 lb

## 2022-06-30 DIAGNOSIS — F02A0 Dementia in other diseases classified elsewhere, mild, without behavioral disturbance, psychotic disturbance, mood disturbance, and anxiety: Secondary | ICD-10-CM | POA: Diagnosis not present

## 2022-06-30 DIAGNOSIS — G301 Alzheimer's disease with late onset: Secondary | ICD-10-CM

## 2022-06-30 MED ORDER — MEMANTINE HCL 5 MG PO TABS: 5.0000 mg | ORAL_TABLET | Freq: Every day | ORAL | 0 refills | Status: DC

## 2022-06-30 MED ORDER — MEMANTINE HCL 5 MG PO TABS: 5.0000 mg | ORAL_TABLET | Freq: Two times a day (BID) | ORAL | 1 refills | Status: DC

## 2022-06-30 NOTE — Assessment & Plan Note (Signed)
I reviewed her MMSE from 03/2022 and she scored a 23/30 which shows mild cognitive impairment.  She has been on aricept for a while and had a setback with her memory in 05/2022.  I am going to add namenda and uptitrate along with aricept.  She is not having worsening depression per her daugther.  I will see her back in 3 months.

## 2022-06-30 NOTE — Progress Notes (Signed)
Office Visit  Subjective   Patient ID: Maria Gilmore   DOB: [birth year]   Age: 81 y.o.   MRN: [id number]   Chief Complaint Chief Complaint  Patient presents with   Follow-up    HTN & Dementia     History of Present Illness [name] returns today for followup of her dementia.  She is accompanied today by her daughter who states that she has had problems with her memory since 2021.  Today, they states that her memory was stable up until 05/22/2022 where she came in with some altered mental status.  My NP did blood work and did not find a cause for this.  She did improve some but she still has a bit of decline in her memory since that visit.  This is problems with short term memories and not really long term memories.  She has had behaviors in the past with agitation where she was placed on seroquel and clonazepam.  This has been effective and they tell me she is not having any agitation at this time.  She has been having some crying episodes recently.  Some nights are better than others with sleep.  The patient does eat well.  She recognizes family but cannot quickly recall names.  She does all her own ADL without problems.  She has not been going to the senior center.  In the past she had a noted B12 deficiency and we treated with B12 supplements. A CT scan of her head was done  07/2019 indicated mild generalized atrophy without appreciable predominance and no evidence of intercranial abnormality. A MRI of her brain was done on 06/21/20 and this showed no acute abnormality but she did have mild parenchymal volume loss and chronic small vessel ischemic disease.  The patient has been on aricept since 2021 and is currently on aricept '10mg'$  qhs.  There is no side effects from this medication.       Past Medical History Past Medical History:  Diagnosis Date   Dementia ([place])    Hypertension    Memory loss    Polycystic disease, ovaries    Swelling of left lower extremity 11/28/2011   LEV-no evidence  of dvt,super. thrombosis     Allergies Allergies  Allergen Reactions   Kenalog [Triamcinolone] Rash   Sulfa Antibiotics Rash     Review of Systems Review of Systems  Constitutional:  Negative for chills and fever.  Respiratory:  Negative for cough and shortness of breath.   Cardiovascular:  Negative for chest pain, palpitations and leg swelling.  Gastrointestinal:  Negative for constipation, diarrhea, nausea and vomiting.  Skin:  Negative for itching and rash.  Psychiatric/Behavioral:  The patient is not nervous/anxious.        Objective:    Vitals BP 114/74 (BP Location: Right Arm, Patient Position: Sitting, Cuff Size: Normal)   Pulse 79   Temp (!) 97.3 F (36.3 C) (Temporal)   Resp 18   Ht '5\' 2"'$  (1.575 m)   Wt 140 lb (63.5 kg)   SpO2 99%   BMI 25.61 kg/m    Physical Examination Physical Exam Constitutional:      Appearance: Normal appearance. She is not ill-appearing.  Cardiovascular:     Rate and Rhythm: Normal rate and regular rhythm.     Pulses: Normal pulses.     Heart sounds: No murmur heard.    No friction rub. No gallop.  Pulmonary:     Effort: Pulmonary effort is normal. No respiratory  distress.     Breath sounds: No wheezing, rhonchi or rales.  Abdominal:     General: Abdomen is flat. Bowel sounds are normal. There is no distension.     Palpations: Abdomen is soft.     Tenderness: There is no abdominal tenderness.  Musculoskeletal:     Right lower leg: No edema.     Left lower leg: No edema.  Skin:    General: Skin is warm and dry.     Findings: No rash.  Neurological:     Mental Status: She is alert.        Assessment & Plan:   Mild late onset Alzheimer's dementia without behavioral disturbance, psychotic disturbance, mood disturbance, or anxiety ([place]) I reviewed her MMSE from 03/2022 and she scored a 23/30 which shows mild cognitive impairment.  She has been on aricept for a while and had a setback with her memory in 05/2022.  I am going  to add namenda and uptitrate along with aricept.  She is not having worsening depression per her daugther.  I will see her back in 3 months.    Return in about 3 months (around 09/29/2022).   Townsend Roger, MD

## 2022-07-14 ENCOUNTER — Other Ambulatory Visit: Payer: Self-pay | Admitting: Cardiovascular Disease

## 2022-07-23 ENCOUNTER — Other Ambulatory Visit: Payer: Self-pay | Admitting: Internal Medicine

## 2022-09-06 ENCOUNTER — Other Ambulatory Visit: Payer: Self-pay | Admitting: Internal Medicine

## 2022-09-06 ENCOUNTER — Encounter: Payer: Self-pay | Admitting: Internal Medicine

## 2022-09-06 ENCOUNTER — Ambulatory Visit: Payer: PPO | Admitting: Internal Medicine

## 2022-09-06 VITALS — BP 112/62 | HR 45 | Temp 97.7°F | Resp 18 | Ht 62.0 in | Wt 138.8 lb

## 2022-09-06 DIAGNOSIS — N390 Urinary tract infection, site not specified: Secondary | ICD-10-CM | POA: Diagnosis not present

## 2022-09-06 DIAGNOSIS — F02A0 Dementia in other diseases classified elsewhere, mild, without behavioral disturbance, psychotic disturbance, mood disturbance, and anxiety: Secondary | ICD-10-CM

## 2022-09-06 LAB — POCT URINALYSIS DIPSTICK
Bilirubin, UA: NEGATIVE
Glucose, UA: NEGATIVE
Ketones, UA: NEGATIVE
Nitrite, UA: NEGATIVE
Protein, UA: NEGATIVE
Spec Grav, UA: 1.02 (ref 1.010–1.025)
Urobilinogen, UA: 0.2 E.U./dL
pH, UA: 7 (ref 5.0–8.0)

## 2022-09-06 MED ORDER — RIVASTIGMINE 4.6 MG/24HR TD PT24: 4.6000 mg | MEDICATED_PATCH | Freq: Every day | TRANSDERMAL | 12 refills | Status: DC

## 2022-09-06 MED ORDER — CIPROFLOXACIN HCL 500 MG PO TABS: 500.0000 mg | ORAL_TABLET | Freq: Two times a day (BID) | ORAL | 0 refills | Status: DC

## 2022-09-06 MED ORDER — SERTRALINE HCL 25 MG PO TABS: 25.0000 mg | ORAL_TABLET | Freq: Every day | ORAL | 2 refills | Status: DC

## 2022-09-06 NOTE — Progress Notes (Unsigned)
Office Visit  Subjective   Patient ID: Maria Gilmore   DOB: [birth year]   Age: 82 y.o.   MRN: [id number]   Chief Complaint Chief Complaint  Patient presents with   Medication Problem    Namenda discussion pt is more weepy; loss of bladder control     History of Present Illness Maria Gilmore returns today for followup of her dementia.  On her last visit, the family noticed a setback with her memory in 05/2022.  I saw her 2 months ago where I added namenda and wanted them to uptitrate.  However, due to some mixup, she did not uptitrate and has been on namenda '5mg'$  in the evening and she remains on aricept '10mg'$  daily.  Her last MMSE was done on 03/2022 and she scored a 23/30 on this test.  She is accompanied today by her daughter who states that she has had problems with her memory since 2021.  Today, they states that her memory was stable up until 05/22/2022 where she came in with some altered mental status.  My NP did blood work and did not find a cause for this.  She did improve some but she still has a bit of decline in her memory since that visit.  This is problems with short term memories and not really long term memories.  She has had behaviors in the past with agitation where she was placed on seroquel and clonazepam.  This has been effective and they tell me she is not having any agitation at this time.  She has been having some crying episodes recently.  Some nights are better than others with sleep.  The patient does eat well.  She recognizes family but cannot quickly recall names.  She does all her own ADL without problems.  She has not been going to the senior center.  In the past she had a noted B12 deficiency and we treated with B12 supplements. A CT scan of her head was done  07/2019 indicated mild generalized atrophy without appreciable predominance and no evidence of intercranial abnormality. A MRI of her brain was done on 06/21/20 and this showed no acute abnormality but she did have mild  parenchymal volume loss and chronic small vessel ischemic disease.  The patient has been on aricept since 2021.  There is no side effects from this medication however they have noted that she is having loss of control of her bladder since starting on namenda.       Past Medical History Past Medical History:  Diagnosis Date   Dementia ([place])    Hypertension    Memory loss    Polycystic disease, ovaries    Swelling of left lower extremity 11/28/2011   LEV-no evidence of dvt,super. thrombosis     Allergies Allergies  Allergen Reactions   Kenalog [Triamcinolone] Rash   Sulfa Antibiotics Rash     Medications  Current Outpatient Medications:    cholecalciferol (VITAMIN D3) 25 MCG (1000 UNIT) tablet, Take 1,000 Units by mouth daily., Disp: , Rfl:    clonazePAM (KLONOPIN) 0.5 MG tablet, Take 0.25 mg by mouth 2 (two) times daily., Disp: , Rfl:    diclofenac (VOLTAREN) 75 MG EC tablet, Take 75 mg by mouth 2 (two) times daily., Disp: , Rfl:    donepezil (ARICEPT) 10 MG tablet, Take 10 mg by mouth at bedtime., Disp: , Rfl:    hydrochlorothiazide (HYDRODIURIL) 25 MG tablet, Take 25 mg by mouth daily. , Disp: , Rfl:  Polyvinyl Alcohol-Povidone (REFRESH OP), Place 1 drop into both eyes daily., Disp: , Rfl:    QUEtiapine (SEROQUEL) 25 MG tablet, Take 25 mg by mouth at bedtime., Disp: , Rfl:    valsartan (DIOVAN) 80 MG tablet, TAKE 1.5 TABLETS (120 MG TOTAL) BY MOUTH AT BEDTIME. *NEW DOSE, Disp: 135 tablet, Rfl: 3   vitamin E 100 UNIT capsule, Take 100 Units by mouth daily., Disp: , Rfl:    Review of Systems Review of Systems  Constitutional:  Negative for chills and fever.  Eyes:  Negative for blurred vision and double vision.  Respiratory:  Negative for cough and shortness of breath.   Cardiovascular:  Negative for chest pain, palpitations and leg swelling.  Gastrointestinal:  Negative for abdominal pain, constipation, diarrhea, nausea and vomiting.  Genitourinary:  Positive for urgency.  Negative for dysuria, frequency and hematuria.  Musculoskeletal:  Negative for myalgias.  Neurological:  Negative for dizziness, weakness and headaches.       Objective:    Vitals BP 112/62 (BP Location: Right Arm, Patient Position: Sitting, Cuff Size: Normal)   Pulse (!) 45   Temp 97.7 F (36.5 C) (Temporal)   Resp 18   Ht '5\' 2"'$  (1.575 m)   Wt 138 lb 12.8 oz (63 kg)   SpO2 99%   BMI 25.39 kg/m    Physical Examination Physical Exam Constitutional:      Appearance: Normal appearance. She is not ill-appearing.  Cardiovascular:     Rate and Rhythm: Normal rate and regular rhythm.     Pulses: Normal pulses.     Heart sounds: No murmur heard.    No friction rub. No gallop.  Pulmonary:     Effort: Pulmonary effort is normal. No respiratory distress.     Breath sounds: No wheezing, rhonchi or rales.  Abdominal:     General: Bowel sounds are normal. There is no distension.     Palpations: Abdomen is soft.     Tenderness: There is no abdominal tenderness.  Musculoskeletal:     Right lower leg: No edema.     Left lower leg: No edema.  Skin:    General: Skin is warm and dry.     Findings: No rash.  Neurological:     General: No focal deficit present.     Mental Status: She is alert and oriented to person, place, and time.  Psychiatric:        Mood and Affect: Mood normal.        Assessment & Plan:   Urinary tract infection without hematuria She is having some overactive bladder where we checked a UA and this was borderline.  We will send for culture and start her on empiric cipro.  Mild late onset Alzheimer's dementia without behavioral disturbance, psychotic disturbance, mood disturbance, or anxiety (Maria Gilmore) She is not having any side effects to the aricept but she is having some urinary urgency with her namenda.  Her daughter states she is having crying episodes.  I am going to continue her on seroquel for her history of behaviors but add sertraline '25mg'$  daily to this and  continue on her aricept.  We will discontinue her namenda and start her on an exelon patch.    Return in about 2 months (around 11/05/2022).   Townsend Roger, MD

## 2022-09-06 NOTE — Assessment & Plan Note (Signed)
She is having some overactive bladder where we checked a UA and this was borderline.  We will send for culture and start her on empiric cipro.

## 2022-09-06 NOTE — Assessment & Plan Note (Signed)
She is not having any side effects to the aricept but she is having some urinary urgency with her namenda.  Her daughter states she is having crying episodes.  I am going to continue her on seroquel for her history of behaviors but add sertraline '25mg'$  daily to this and continue on her aricept.  We will discontinue her namenda and start her on an exelon patch.

## 2022-09-08 LAB — URINE CULTURE

## 2022-09-27 ENCOUNTER — Ambulatory Visit: Payer: PPO | Admitting: Internal Medicine

## 2022-09-27 DIAGNOSIS — U071 COVID-19: Secondary | ICD-10-CM | POA: Diagnosis not present

## 2022-09-27 DIAGNOSIS — F0390 Unspecified dementia without behavioral disturbance: Secondary | ICD-10-CM | POA: Diagnosis not present

## 2022-09-27 DIAGNOSIS — E860 Dehydration: Secondary | ICD-10-CM | POA: Diagnosis not present

## 2022-09-27 DIAGNOSIS — M47814 Spondylosis without myelopathy or radiculopathy, thoracic region: Secondary | ICD-10-CM | POA: Diagnosis not present

## 2022-09-27 DIAGNOSIS — R531 Weakness: Secondary | ICD-10-CM | POA: Diagnosis not present

## 2022-09-28 ENCOUNTER — Other Ambulatory Visit: Payer: Self-pay | Admitting: Internal Medicine

## 2022-09-28 DIAGNOSIS — U071 COVID-19: Secondary | ICD-10-CM | POA: Diagnosis not present

## 2022-09-28 DIAGNOSIS — M47814 Spondylosis without myelopathy or radiculopathy, thoracic region: Secondary | ICD-10-CM | POA: Diagnosis not present

## 2022-10-10 DIAGNOSIS — J209 Acute bronchitis, unspecified: Secondary | ICD-10-CM | POA: Diagnosis not present

## 2022-10-10 DIAGNOSIS — R0602 Shortness of breath: Secondary | ICD-10-CM | POA: Diagnosis not present

## 2022-10-23 ENCOUNTER — Other Ambulatory Visit: Payer: Self-pay | Admitting: Internal Medicine

## 2022-10-23 MED ORDER — CLONAZEPAM 0.5 MG PO TABS: 0.2500 mg | ORAL_TABLET | Freq: Two times a day (BID) | ORAL | 2 refills | Status: DC

## 2022-10-26 ENCOUNTER — Other Ambulatory Visit: Payer: Self-pay

## 2022-10-26 MED ORDER — DONEPEZIL HCL 10 MG PO TABS: 10.0000 mg | ORAL_TABLET | Freq: Every day | ORAL | 1 refills | Status: AC

## 2022-10-26 MED ORDER — QUETIAPINE FUMARATE 25 MG PO TABS: 25.0000 mg | ORAL_TABLET | Freq: Every day | ORAL | 1 refills | Status: DC

## 2022-11-03 ENCOUNTER — Ambulatory Visit: Payer: PPO | Admitting: Internal Medicine

## 2022-11-20 DIAGNOSIS — D224 Melanocytic nevi of scalp and neck: Secondary | ICD-10-CM | POA: Diagnosis not present

## 2022-11-20 DIAGNOSIS — L821 Other seborrheic keratosis: Secondary | ICD-10-CM | POA: Diagnosis not present

## 2022-11-20 DIAGNOSIS — D1801 Hemangioma of skin and subcutaneous tissue: Secondary | ICD-10-CM | POA: Diagnosis not present

## 2022-11-20 DIAGNOSIS — L570 Actinic keratosis: Secondary | ICD-10-CM | POA: Diagnosis not present

## 2022-11-20 DIAGNOSIS — D2261 Melanocytic nevi of right upper limb, including shoulder: Secondary | ICD-10-CM | POA: Diagnosis not present

## 2022-11-20 DIAGNOSIS — L720 Epidermal cyst: Secondary | ICD-10-CM | POA: Diagnosis not present

## 2022-11-20 DIAGNOSIS — L853 Xerosis cutis: Secondary | ICD-10-CM | POA: Diagnosis not present

## 2022-11-20 DIAGNOSIS — L218 Other seborrheic dermatitis: Secondary | ICD-10-CM | POA: Diagnosis not present

## 2022-12-25 DIAGNOSIS — F02B18 Dementia in other diseases classified elsewhere, moderate, with other behavioral disturbance: Secondary | ICD-10-CM | POA: Diagnosis not present

## 2022-12-25 DIAGNOSIS — Z0000 Encounter for general adult medical examination without abnormal findings: Secondary | ICD-10-CM | POA: Diagnosis not present

## 2022-12-25 DIAGNOSIS — I10 Essential (primary) hypertension: Secondary | ICD-10-CM | POA: Diagnosis not present

## 2022-12-25 DIAGNOSIS — F321 Major depressive disorder, single episode, moderate: Secondary | ICD-10-CM | POA: Diagnosis not present

## 2022-12-25 DIAGNOSIS — F5101 Primary insomnia: Secondary | ICD-10-CM | POA: Diagnosis not present

## 2022-12-25 DIAGNOSIS — G301 Alzheimer's disease with late onset: Secondary | ICD-10-CM | POA: Diagnosis not present

## 2023-01-12 DIAGNOSIS — M8580 Other specified disorders of bone density and structure, unspecified site: Secondary | ICD-10-CM | POA: Diagnosis not present

## 2023-01-12 DIAGNOSIS — Z9849 Cataract extraction status, unspecified eye: Secondary | ICD-10-CM | POA: Diagnosis not present

## 2023-01-12 DIAGNOSIS — F03911 Unspecified dementia, unspecified severity, with agitation: Secondary | ICD-10-CM | POA: Diagnosis not present

## 2023-01-12 DIAGNOSIS — E559 Vitamin D deficiency, unspecified: Secondary | ICD-10-CM | POA: Diagnosis not present

## 2023-01-12 DIAGNOSIS — R32 Unspecified urinary incontinence: Secondary | ICD-10-CM | POA: Diagnosis not present

## 2023-01-12 DIAGNOSIS — F320 Major depressive disorder, single episode, mild: Secondary | ICD-10-CM | POA: Diagnosis not present

## 2023-01-12 DIAGNOSIS — F419 Anxiety disorder, unspecified: Secondary | ICD-10-CM | POA: Diagnosis not present

## 2023-01-12 DIAGNOSIS — I10 Essential (primary) hypertension: Secondary | ICD-10-CM | POA: Diagnosis not present

## 2023-01-12 DIAGNOSIS — E782 Mixed hyperlipidemia: Secondary | ICD-10-CM | POA: Diagnosis not present

## 2023-01-12 DIAGNOSIS — Z86718 Personal history of other venous thrombosis and embolism: Secondary | ICD-10-CM | POA: Diagnosis not present

## 2023-01-25 ENCOUNTER — Other Ambulatory Visit: Payer: Self-pay | Admitting: Internal Medicine

## 2023-01-28 ENCOUNTER — Other Ambulatory Visit: Payer: Self-pay | Admitting: Internal Medicine

## 2023-04-19 ENCOUNTER — Other Ambulatory Visit: Payer: Self-pay | Admitting: Internal Medicine

## 2023-07-18 ENCOUNTER — Ambulatory Visit: Payer: PPO | Admitting: Cardiovascular Disease

## 2023-07-18 DIAGNOSIS — F02B18 Dementia in other diseases classified elsewhere, moderate, with other behavioral disturbance: Secondary | ICD-10-CM | POA: Diagnosis not present

## 2023-07-18 DIAGNOSIS — G301 Alzheimer's disease with late onset: Secondary | ICD-10-CM | POA: Diagnosis not present

## 2023-07-18 DIAGNOSIS — I10 Essential (primary) hypertension: Secondary | ICD-10-CM | POA: Diagnosis not present

## 2023-07-18 DIAGNOSIS — Z23 Encounter for immunization: Secondary | ICD-10-CM | POA: Diagnosis not present

## 2023-07-18 DIAGNOSIS — F321 Major depressive disorder, single episode, moderate: Secondary | ICD-10-CM | POA: Diagnosis not present

## 2023-07-18 DIAGNOSIS — F02B3 Dementia in other diseases classified elsewhere, moderate, with mood disturbance: Secondary | ICD-10-CM | POA: Diagnosis not present

## 2023-07-18 DIAGNOSIS — F419 Anxiety disorder, unspecified: Secondary | ICD-10-CM | POA: Diagnosis not present

## 2023-08-27 ENCOUNTER — Ambulatory Visit: Payer: PPO | Attending: Cardiovascular Disease | Admitting: Cardiovascular Disease

## 2023-08-27 ENCOUNTER — Encounter: Payer: Self-pay | Admitting: Cardiovascular Disease

## 2023-08-27 VITALS — BP 130/78 | HR 54 | Ht 62.0 in | Wt 136.0 lb

## 2023-08-27 DIAGNOSIS — I471 Supraventricular tachycardia, unspecified: Secondary | ICD-10-CM | POA: Diagnosis not present

## 2023-08-27 DIAGNOSIS — I10 Essential (primary) hypertension: Secondary | ICD-10-CM | POA: Diagnosis not present

## 2023-08-27 DIAGNOSIS — E782 Mixed hyperlipidemia: Secondary | ICD-10-CM | POA: Diagnosis not present

## 2023-08-27 NOTE — Assessment & Plan Note (Signed)
History of essential hypertension with blood pressure measured today at 130/78.  She is on HydroDIURIL, and valsartan.

## 2023-08-27 NOTE — Progress Notes (Unsigned)
08/27/2023 Stephinie Battisti   08/25/1940  [id number]  Primary Physician Gordan Payment., MD Primary Cardiologist: Runell Gess MD Nicholes Calamity, [place]  HPI:  Maria Gilmore is a 83 y.o.    thin-appearing widowed Caucasian female, mother of 2 children, grandmother of 5 grandchildren who I initially saw at the request of Dr. Marvel Plan 01/13/2022. I last saw her in the office 09/07/2021.  She is accompanied by one of her daughters Toniann Fail who is a Runner, broadcasting/film/video and lives close by.  She lives alone but but does have home health care during the daytime hours.  She has a history of hypertension and polycystic ovarian disease on metformin. She had a Myoview stress test performed 08/26/12 which is entirely normal.    She saw Gillian Shields in the office September 2022 for palpitations.  Event monitor showed frequent PVCs, and 2D echo was essentially normal.  She was begun on beta-blocker which resulted in bradycardia and this was separately discontinued.  She then saw Dr. Servando Salina  in the office on 06/22/2021 for left lower extremity swelling.  Venous Doppler showed DVT.  She was begun on Xarelto and this has resolved.  Since I saw her in the office approximately a year and a half ago she continues remain stable.  She has had decline in her cognitive capacity and progressive dementia.  She does not get out of the house much she..  She denies chest pain or shortness of breath.  We talked about addressing her CODE STATUS today.   Current Meds  Medication Sig   cholecalciferol (VITAMIN D3) 25 MCG (1000 UNIT) tablet Take 1,000 Units by mouth daily.   donepezil (ARICEPT) 10 MG tablet Take 1 tablet (10 mg total) by mouth at bedtime.   hydrochlorothiazide (HYDRODIURIL) 25 MG tablet Take 25 mg by mouth daily.    QUEtiapine (SEROQUEL) 25 MG tablet TAKE 1 TABLET BY MOUTH EVERYDAY AT BEDTIME   sertraline (ZOLOFT) 25 MG tablet TAKE 1 TABLET (25 MG TOTAL) BY MOUTH DAILY.   valsartan (DIOVAN) 80 MG tablet TAKE 1.5  TABLETS (120 MG TOTAL) BY MOUTH AT BEDTIME. *NEW DOSE   vitamin E 100 UNIT capsule Take 100 Units by mouth daily.     Allergies  Allergen Reactions   Kenalog [Triamcinolone] Rash   Sulfa Antibiotics Rash    Social History   Socioeconomic History   Marital status: Married    Spouse name: Not on file   Number of children: 2   Years of education: 12   Highest education level: Not on file  Occupational History   Not on file  Tobacco Use   Smoking status: Never   Smokeless tobacco: Never  Substance and Sexual Activity   Alcohol use: No   Drug use: No   Sexual activity: Not on file  Other Topics Concern   Not on file  Social History Narrative   01/05/21 lives alone, dgtr < 1 mile away   Coffee, several c daily   Social Drivers of Corporate investment banker Strain: Not on file  Food Insecurity: Low Risk  (07/18/2023)   Received from [facility]   Hunger Vital Sign    Worried About Running Out of Food in the Last Year: Never true    Ran Out of Food in the Last Year: Never true  Transportation Needs: No Transportation Needs (07/18/2023)   Received from [facility]    In the past 12 months, has lack of  reliable transportation kept you from medical appointments, meetings, work or from getting things needed for daily living? : No  Physical Activity: Not on file  Stress: Not on file  Social Connections: Not on file  Intimate Partner Violence: Not on file     Review of Systems: General: negative for chills, fever, night sweats or weight changes.  Cardiovascular: negative for chest pain, dyspnea on exertion, edema, orthopnea, palpitations, paroxysmal nocturnal dyspnea or shortness of breath Dermatological: negative for rash Respiratory: negative for cough or wheezing Urologic: negative for hematuria Abdominal: negative for nausea, vomiting, diarrhea, bright red blood per rectum, melena, or hematemesis Neurologic: negative for visual changes, syncope, or  dizziness All other systems reviewed and are otherwise negative except as noted above.    Blood pressure 130/78, pulse (!) 54, height 5\' 2"  (1.575 m), weight 136 lb (61.7 kg), SpO2 94%.  General appearance: alert and no distress Neck: no adenopathy, no carotid bruit, no JVD, supple, symmetrical, trachea midline, and thyroid not enlarged, symmetric, no tenderness/mass/nodules Lungs: clear to auscultation bilaterally Heart: regular rate and rhythm, S1, S2 normal, no murmur, click, rub or gallop Extremities: extremities normal, atraumatic, no cyanosis or edema Pulses: 2+ and symmetric Skin: Skin color, texture, turgor normal. No rashes or lesions Neurologic: Grossly normal  EKG EKG Interpretation Date/Time:  Monday August 27 2023 16:15:04 EST Ventricular Rate:  54 PR Interval:  128 QRS Duration:  72 QT Interval:  426 QTC Calculation: 403 R Axis:   7  Text Interpretation: Sinus bradycardia Possible Left atrial enlargement Septal infarct , age undetermined When compared with ECG of 30-Aug-2021 21:02, Septal infarct is now Present QT has shortened Confirmed by Nanetta Batty [phone]) on 08/27/2023 4:34:23 PM    ASSESSMENT AND PLAN:   HTN (hypertension) History of essential hypertension with blood pressure measured today at 130/78.  She is on HydroDIURIL, and valsartan.  Hyperlipidemia History of hyperlipidemia not on statin therapy with lipid profile performed 04/04/2022 revealing total cholesterol 178, LDL 108 and HDL of 46.     Runell Gess MD FACP,FACC,FAHA, [facility] 08/27/2023 4:43 PM

## 2023-08-27 NOTE — Assessment & Plan Note (Signed)
History of hyperlipidemia not on statin therapy with lipid profile performed 04/04/2022 revealing total cholesterol 178, LDL 108 and HDL of 46.

## 2023-08-27 NOTE — Patient Instructions (Signed)
 Medication Instructions:  Your physician recommends that you continue on your current medications as directed. Please refer to the Current Medication list given to you today.  *If you need a refill on your cardiac medications before your next appointment, please call your pharmacy*   Follow-Up: At [facility], you and your health needs are our priority.  As part of our continuing mission to provide you with exceptional heart care, we have created designated Provider Care Teams.  These Care Teams include your primary Cardiologist (physician) and Advanced Practice Providers (APPs -  Physician Assistants and Nurse Practitioners) who all work together to provide you with the care you need, when you need it.  We recommend signing up for the patient portal called "MyChart".  Sign up information is provided on this After Visit Summary.  MyChart is used to connect with patients for Virtual Visits (Telemedicine).  Patients are able to view lab/test results, encounter notes, upcoming appointments, etc.  Non-urgent messages can be sent to your provider as well.   To learn more about what you can do with MyChart, go to [email or web address].    Your next appointment:   We will see you on an as needed basis.  Provider:   Nanetta Batty, MD

## 2023-09-09 ENCOUNTER — Other Ambulatory Visit: Payer: Self-pay | Admitting: Cardiovascular Disease

## 2023-11-28 ENCOUNTER — Ambulatory Visit (HOSPITAL_BASED_OUTPATIENT_CLINIC_OR_DEPARTMENT_OTHER)
Admission: RE | Admit: 2023-11-28 | Discharge: 2023-11-28 | Disposition: A | Discharge status: Home / Self Care | Source: Ambulatory Visit | Attending: Family Medicine | Admitting: Family Medicine

## 2023-11-28 ENCOUNTER — Encounter (HOSPITAL_BASED_OUTPATIENT_CLINIC_OR_DEPARTMENT_OTHER): Payer: Self-pay

## 2023-11-28 VITALS — BP 123/83 | Temp 98.1°F | Resp 20

## 2023-11-28 DIAGNOSIS — R4182 Altered mental status, unspecified: Secondary | ICD-10-CM | POA: Insufficient documentation

## 2023-11-28 DIAGNOSIS — R5383 Other fatigue: Secondary | ICD-10-CM | POA: Diagnosis not present

## 2023-11-28 DIAGNOSIS — H6123 Impacted cerumen, bilateral: Secondary | ICD-10-CM | POA: Insufficient documentation

## 2023-11-28 DIAGNOSIS — R112 Nausea with vomiting, unspecified: Secondary | ICD-10-CM | POA: Insufficient documentation

## 2023-11-28 DIAGNOSIS — R197 Diarrhea, unspecified: Secondary | ICD-10-CM | POA: Insufficient documentation

## 2023-11-28 DIAGNOSIS — R410 Disorientation, unspecified: Secondary | ICD-10-CM | POA: Insufficient documentation

## 2023-11-28 LAB — POCT URINALYSIS DIP (MANUAL ENTRY)
Bilirubin, UA: NEGATIVE
Blood, UA: NEGATIVE
Glucose, UA: NEGATIVE mg/dL
Ketones, POC UA: NEGATIVE mg/dL
Leukocytes, UA: NEGATIVE
Nitrite, UA: NEGATIVE
Protein Ur, POC: NEGATIVE mg/dL
Spec Grav, UA: 1.02 (ref 1.010–1.025)
Urobilinogen, UA: 0.2 U/dL
pH, UA: 7 (ref 5.0–8.0)

## 2023-11-28 NOTE — Discharge Instructions (Addendum)
 Confusion, altered mental status, lethargy with nausea vomiting and diarrhea: Urinalysis was normal.  Urine culture sent.  Vital signs are normal.  Exam is normal.  Patient has dementia and is really not able to communicate her concerns.  Daughter wanted her evaluated in case she had an occult UTI or other problem that might need treatment before she got worse.  Will evaluate the urine with a culture and if the culture is abnormal we will contact the daughter and get the patient on treatment.  Encouraged rehydration or good fluid intake.  Follow-up as needed.  Could return for ear cleaning when desired.  There is hard wax in your ear(s).  It should be softened before someone tries to rinse out the ears.  Use Colace gelcaps (an over-the-counter stool softener).  Use a large needle or a safety pin to poke a hole in 1 part of the gelcap.  Squeeze 1 or 2 gelcaps into an ear canal and then put a cottonball in the ear.  Do this to both ears if both ears have wax.  Do this at night prior to sleep (for 2-4 nights) and just prior to a planned visit for ear cleaning.  This will soften the wax so that it is easy to clean your ears out.

## 2023-11-28 NOTE — ED Provider Notes (Signed)
 [facility]    CSN: [id number] Arrival date & time: 11/28/23  1510      History   Chief Complaint Chief Complaint  Patient presents with   Emesis   Diarrhea    HPI Maria [facility] is a 83 y.o. female.   The patient has dementia.  Her daughter is providing medical history.  On 11/27/2023, the patient had vomiting x 1 and at least 1 loose stool or diarrhea.  She has a caregiver who updates her daughter regularly and the caregiver updated her about all of this yesterday.  She did not have fever.  She has not had nausea vomiting or diarrhea today.  Today she seems more mentally out of it than normal and is sleeping quite a bit.  These are not usual symptoms for her mother and the daughter is concerned that maybe she has a UTI or something else going on.  The patient is drinking very little fluids today.  Daughter denies burning, frequency, urgency of urination.   Emesis Associated symptoms: diarrhea   Associated symptoms: no abdominal pain, no arthralgias, no chills, no cough, no fever and no sore throat   Diarrhea Associated symptoms: vomiting   Associated symptoms: no abdominal pain, no arthralgias, no chills and no fever     Past Medical History:  Diagnosis Date   Dementia (HCC)    Hypertension    Memory loss    Polycystic disease, ovaries    Swelling of left lower extremity 11/28/2011   LEV-no evidence of dvt,super. thrombosis    Patient Active Problem List   Diagnosis Date Noted   Urinary tract infection without hematuria 09/06/2022   Mild late onset Alzheimer's dementia without behavioral disturbance, psychotic disturbance, mood disturbance, or anxiety (HCC) 06/30/2022   Bradycardia 08/31/2021   Deep vein thrombosis (DVT) of left lower extremity (HCC) 06/22/2021   Frequent PVCs 06/22/2021   PSVT (paroxysmal supraventricular tachycardia) (HCC) 06/22/2021   Hyperlipidemia 03/19/2019   HTN (hypertension) 06/03/2013   Polycystic ovarian disease 06/03/2013     Past Surgical History:  Procedure Laterality Date   ABDOMINAL HYSTERECTOMY     CHOLECYSTECTOMY     exerc stress test     nuclear stress test  08/16/2012   EF 80%; NL LV FX,NL wall motion    OB History   No obstetric history on file.      Home Medications    Prior to Admission medications   Medication Sig Start Date End Date Taking? Authorizing Provider  cholecalciferol (VITAMIN D3) 25 MCG (1000 UNIT) tablet Take 1,000 Units by mouth daily.    [provider]  donepezil  (ARICEPT ) 10 MG tablet Take 1 tablet (10 mg total) by mouth at bedtime. 10/26/22   Wayne Haines, MD  hydrochlorothiazide (HYDRODIURIL) 25 MG tablet Take 25 mg by mouth daily.     [provider]  QUEtiapine  (SEROQUEL ) 25 MG tablet TAKE 1 TABLET BY MOUTH EVERYDAY AT BEDTIME 01/31/23   Mitcheal Amy, Reymundo Caulk, MD  sertraline  (ZOLOFT ) 25 MG tablet TAKE 1 TABLET (25 MG TOTAL) BY MOUTH DAILY. 09/29/22   Wayne Haines, MD  valsartan  (DIOVAN ) 80 MG tablet Take 1.5 tablets (120 mg total) by mouth at bedtime. 09/12/23   Avanell Leigh, MD  vitamin E 100 UNIT capsule Take 100 Units by mouth daily.    [provider]    Family History Family History  Problem Relation Age of Onset   Thyroid  disease Mother    Dementia Mother  Hypertension Father    Prostate cancer Father     Social History Social History   Tobacco Use   Smoking status: Never   Smokeless tobacco: Never  Substance Use Topics   Alcohol  use: No   Drug use: No     Allergies   Kenalog [triamcinolone] and Sulfa antibiotics   Review of Systems Review of Systems  Constitutional:  Positive for fatigue. Negative for chills and fever.  HENT:  Negative for ear pain and sore throat.   Eyes:  Negative for pain and visual disturbance.  Respiratory:  Negative for cough and shortness of breath.   Cardiovascular:  Negative for chest pain and palpitations.  Gastrointestinal:  Positive for diarrhea, nausea and vomiting. Negative for  abdominal pain and constipation.  Genitourinary:  Negative for dysuria and hematuria.  Musculoskeletal:  Negative for arthralgias and back pain.  Skin:  Negative for color change and rash.  Neurological:  Negative for seizures and syncope.  Psychiatric/Behavioral:  Positive for confusion.   All other systems reviewed and are negative.    Physical Exam Triage Vital Signs ED Triage Vitals  Encounter Vitals Group     BP 11/28/23 1526 123/83     Systolic BP Percentile --      Diastolic BP Percentile --      Pulse --      Resp 11/28/23 1526 20     Temp 11/28/23 1526 98.1 F (36.7 C)     Temp Source 11/28/23 1526 Oral     SpO2 11/28/23 1526 97 %     Weight --      Height --      Head Circumference --      Peak Flow --      Pain Score 11/28/23 1530 0     Pain Loc --      Pain Education --      Exclude from Growth Chart --    No data found.  Updated Vital Signs BP 123/83   Temp 98.1 F (36.7 C) (Oral)   Resp 20   SpO2 97%   Visual Acuity Right Eye Distance:   Left Eye Distance:   Bilateral Distance:    Right Eye Near:   Left Eye Near:    Bilateral Near:     Physical Exam Vitals and nursing note reviewed.  Constitutional:      General: She is not in acute distress.    Appearance: She is well-developed. She is not ill-appearing or toxic-appearing.  HENT:     Head: Normocephalic and atraumatic.     Right Ear: Hearing, tympanic membrane, ear canal and external ear normal.     Left Ear: Hearing, tympanic membrane, ear canal and external ear normal.     Nose: No congestion or rhinorrhea.     Right Sinus: No maxillary sinus tenderness or frontal sinus tenderness.     Left Sinus: No maxillary sinus tenderness or frontal sinus tenderness.     Mouth/Throat:     Lips: Pink.     Mouth: Mucous membranes are moist.     Pharynx: Uvula midline. No oropharyngeal exudate or posterior oropharyngeal erythema.     Tonsils: No tonsillar exudate.  Eyes:     Conjunctiva/sclera:  Conjunctivae normal.     Pupils: Pupils are equal, round, and reactive to light.  Cardiovascular:     Rate and Rhythm: Normal rate and regular rhythm.     Heart sounds: S1 normal and S2 normal. No murmur heard. Pulmonary:  Effort: Pulmonary effort is normal. No respiratory distress.     Breath sounds: Normal breath sounds. No decreased breath sounds, wheezing, rhonchi or rales.  Abdominal:     General: Bowel sounds are normal.     Palpations: Abdomen is soft.     Tenderness: There is no abdominal tenderness.  Musculoskeletal:        General: No swelling.     Cervical back: Neck supple.  Lymphadenopathy:     Head:     Right side of head: No submental, submandibular, tonsillar, preauricular or posterior auricular adenopathy.     Left side of head: No submental, submandibular, tonsillar, preauricular or posterior auricular adenopathy.     Cervical: No cervical adenopathy.     Right cervical: No superficial cervical adenopathy.    Left cervical: No superficial cervical adenopathy.  Skin:    General: Skin is warm and dry.     Capillary Refill: Capillary refill takes less than 2 seconds.     Findings: No rash.  Neurological:     Mental Status: She is alert and oriented to person, place, and time.  Psychiatric:        Mood and Affect: Mood normal.        Behavior: Behavior is cooperative.        Cognition and Memory: Cognition is impaired. Memory is impaired. She exhibits impaired recent memory and impaired remote memory.     Comments: Patient has significant impairment of memory Short and long-term.  She is not able to really answer any questions.  She is alert but she is moving very slow and responding slowly when she speaks.      UC Treatments / Results  Labs (all labs ordered are listed, but only abnormal results are displayed) Labs Reviewed  URINE CULTURE  POCT URINALYSIS DIP (MANUAL ENTRY)    EKG   Radiology No results found.  Procedures Procedures (including  critical care time)  Medications Ordered in UC Medications - No data to display  Initial Impression / Assessment and Plan / UC Course  I have reviewed the triage vital signs and the nursing notes.  Pertinent labs & imaging results that were available during my care of the patient were reviewed by me and considered in my medical decision making (see chart for details).  Plan of Care: Confusion and altered mental status with nausea, vomiting, diarrhea.  Urinalysis is normal.  Exam is normal.  Urine culture sent.  Will adjust the plan of care, if needed once the culture results.  Follow-up if symptoms do not improve, worsen or new symptoms occur.  Is hard to understand what is going on because the patient really cannot communicate any thing about her condition.  I reviewed the plan of care with the patient and/or the patient's guardian.  The patient and/or guardian had time to ask questions and acknowledged that the questions were answered.  I provided instruction on symptoms or reasons to return here or to go to an ER, if symptoms/condition did not improve, worsened or if new symptoms occurred.  Final Clinical Impressions(s) / UC Diagnoses   Final diagnoses:  Confusion  Nausea and vomiting, unspecified vomiting type  Diarrhea, unspecified type  Lethargic  Bilateral impacted cerumen  Altered mental status, unspecified altered mental status type     Discharge Instructions      Confusion, altered mental status, lethargy with nausea vomiting and diarrhea: Urinalysis was normal.  Urine culture sent.  Vital signs are normal.  Exam is normal.  Patient has dementia and is really not able to communicate her concerns.  Daughter wanted her evaluated in case she had an occult UTI or other problem that might need treatment before she got worse.  Will evaluate the urine with a culture and if the culture is abnormal we will contact the daughter and get the patient on treatment.  Encouraged  rehydration or good fluid intake.  Follow-up as needed.  Could return for ear cleaning when desired.  There is hard wax in your ear(s).  It should be softened before someone tries to rinse out the ears.  Use Colace gelcaps (an over-the-counter stool softener).  Use a large needle or a safety pin to poke a hole in 1 part of the gelcap.  Squeeze 1 or 2 gelcaps into an ear canal and then put a cottonball in the ear.  Do this to both ears if both ears have wax.  Do this at night prior to sleep (for 2-4 nights) and just prior to a planned visit for ear cleaning.  This will soften the wax so that it is easy to clean your ears out.    ED Prescriptions   None    PDMP not reviewed this encounter.   Guss Legacy, FNP 11/28/23 1623

## 2023-11-28 NOTE — ED Triage Notes (Addendum)
 Per patient's daughter, patient had an episode of vomiting then an episode of diarrhea yesterday. Patient was sleeping a lot today so daughter thought since that was abnormal for patient, she would bring patient in. No urinary symptoms. No frequency. Patient did eat and drink today without issue.

## 2023-11-30 LAB — URINE CULTURE

## 2023-12-03 ENCOUNTER — Ambulatory Visit (HOSPITAL_COMMUNITY): Payer: Self-pay

## 2023-12-24 DIAGNOSIS — F419 Anxiety disorder, unspecified: Secondary | ICD-10-CM | POA: Diagnosis not present

## 2023-12-24 DIAGNOSIS — I10 Essential (primary) hypertension: Secondary | ICD-10-CM | POA: Diagnosis not present

## 2023-12-24 DIAGNOSIS — G301 Alzheimer's disease with late onset: Secondary | ICD-10-CM | POA: Diagnosis not present

## 2023-12-24 DIAGNOSIS — F02B3 Dementia in other diseases classified elsewhere, moderate, with mood disturbance: Secondary | ICD-10-CM | POA: Diagnosis not present

## 2023-12-24 DIAGNOSIS — F02B18 Dementia in other diseases classified elsewhere, moderate, with other behavioral disturbance: Secondary | ICD-10-CM | POA: Diagnosis not present

## 2023-12-24 DIAGNOSIS — E782 Mixed hyperlipidemia: Secondary | ICD-10-CM | POA: Diagnosis not present

## 2023-12-24 DIAGNOSIS — F321 Major depressive disorder, single episode, moderate: Secondary | ICD-10-CM | POA: Diagnosis not present

## 2023-12-24 DIAGNOSIS — F5101 Primary insomnia: Secondary | ICD-10-CM | POA: Diagnosis not present

## 2023-12-24 DIAGNOSIS — Z0000 Encounter for general adult medical examination without abnormal findings: Secondary | ICD-10-CM | POA: Diagnosis not present

## 2024-01-07 DIAGNOSIS — D225 Melanocytic nevi of trunk: Secondary | ICD-10-CM | POA: Diagnosis not present

## 2024-01-07 DIAGNOSIS — L820 Inflamed seborrheic keratosis: Secondary | ICD-10-CM | POA: Diagnosis not present

## 2024-01-07 DIAGNOSIS — L814 Other melanin hyperpigmentation: Secondary | ICD-10-CM | POA: Diagnosis not present

## 2024-01-07 DIAGNOSIS — D2262 Melanocytic nevi of left upper limb, including shoulder: Secondary | ICD-10-CM | POA: Diagnosis not present

## 2024-01-07 DIAGNOSIS — D224 Melanocytic nevi of scalp and neck: Secondary | ICD-10-CM | POA: Diagnosis not present

## 2024-01-07 DIAGNOSIS — S90861A Insect bite (nonvenomous), right foot, initial encounter: Secondary | ICD-10-CM | POA: Diagnosis not present

## 2024-01-07 DIAGNOSIS — D1801 Hemangioma of skin and subcutaneous tissue: Secondary | ICD-10-CM | POA: Diagnosis not present

## 2024-01-07 DIAGNOSIS — L821 Other seborrheic keratosis: Secondary | ICD-10-CM | POA: Diagnosis not present

## 2024-01-07 DIAGNOSIS — L718 Other rosacea: Secondary | ICD-10-CM | POA: Diagnosis not present

## 2024-02-24 DIAGNOSIS — N3001 Acute cystitis with hematuria: Secondary | ICD-10-CM | POA: Diagnosis not present

## 2024-02-25 DIAGNOSIS — N3001 Acute cystitis with hematuria: Secondary | ICD-10-CM | POA: Diagnosis not present

## 2024-05-23 ENCOUNTER — Other Ambulatory Visit: Payer: Self-pay

## 2024-06-13 DIAGNOSIS — R34 Anuria and oliguria: Secondary | ICD-10-CM | POA: Diagnosis not present
